# Patient Record
Sex: Male | Born: 1960 | Race: Black or African American | Hispanic: No | Smoking: Current some day smoker
Health system: Southern US, Community
[De-identification: ages and names within clinical notes are randomized; demographics above are authoritative.]

## PROBLEM LIST (undated history)

## (undated) DIAGNOSIS — J90 Pleural effusion, not elsewhere classified: Secondary | ICD-10-CM

## (undated) DIAGNOSIS — M1990 Unspecified osteoarthritis, unspecified site: Secondary | ICD-10-CM

## (undated) DIAGNOSIS — N189 Chronic kidney disease, unspecified: Secondary | ICD-10-CM

## (undated) DIAGNOSIS — C3430 Malignant neoplasm of lower lobe, unspecified bronchus or lung: Secondary | ICD-10-CM

## (undated) DIAGNOSIS — C3490 Malignant neoplasm of unspecified part of unspecified bronchus or lung: Secondary | ICD-10-CM

## (undated) DIAGNOSIS — B1920 Unspecified viral hepatitis C without hepatic coma: Secondary | ICD-10-CM

## (undated) DIAGNOSIS — F1020 Alcohol dependence, uncomplicated: Secondary | ICD-10-CM

## (undated) DIAGNOSIS — K219 Gastro-esophageal reflux disease without esophagitis: Secondary | ICD-10-CM

## (undated) DIAGNOSIS — N179 Acute kidney failure, unspecified: Secondary | ICD-10-CM

## (undated) DIAGNOSIS — I10 Essential (primary) hypertension: Secondary | ICD-10-CM

## (undated) HISTORY — DX: Alcohol dependence, uncomplicated: F10.20

## (undated) HISTORY — DX: Acute kidney failure, unspecified: N17.9

## (undated) HISTORY — PX: CARPAL TUNNEL RELEASE: SHX101

## (undated) HISTORY — DX: Malignant neoplasm of lower lobe, unspecified bronchus or lung: C34.30

## (undated) HISTORY — DX: Pleural effusion, not elsewhere classified: J90

## (undated) HISTORY — DX: Unspecified viral hepatitis C without hepatic coma: B19.20

---

## 1998-12-30 ENCOUNTER — Ambulatory Visit (HOSPITAL_COMMUNITY): Admission: RE | Admit: 1998-12-30 | Discharge: 1998-12-30 | Payer: Self-pay | Admitting: Family Medicine

## 1998-12-30 ENCOUNTER — Encounter: Payer: Self-pay | Admitting: Family Medicine

## 2002-12-31 ENCOUNTER — Encounter: Payer: Self-pay | Admitting: Orthopedic Surgery

## 2002-12-31 ENCOUNTER — Ambulatory Visit (HOSPITAL_COMMUNITY): Admission: RE | Admit: 2002-12-31 | Discharge: 2002-12-31 | Payer: Self-pay | Admitting: Orthopedic Surgery

## 2003-01-02 ENCOUNTER — Ambulatory Visit (HOSPITAL_COMMUNITY): Admission: RE | Admit: 2003-01-02 | Discharge: 2003-01-02 | Payer: Self-pay | Admitting: Orthopedic Surgery

## 2003-01-02 ENCOUNTER — Encounter: Payer: Self-pay | Admitting: Orthopedic Surgery

## 2003-09-13 ENCOUNTER — Emergency Department (HOSPITAL_COMMUNITY): Admission: AD | Admit: 2003-09-13 | Discharge: 2003-09-13 | Payer: Self-pay | Admitting: Family Medicine

## 2003-10-05 ENCOUNTER — Emergency Department (HOSPITAL_COMMUNITY): Admission: EM | Admit: 2003-10-05 | Discharge: 2003-10-05 | Payer: Self-pay | Admitting: Family Medicine

## 2008-10-18 ENCOUNTER — Emergency Department (HOSPITAL_COMMUNITY): Admission: EM | Admit: 2008-10-18 | Discharge: 2008-10-18 | Payer: Self-pay | Admitting: Family Medicine

## 2009-01-14 ENCOUNTER — Emergency Department (HOSPITAL_COMMUNITY): Admission: EM | Admit: 2009-01-14 | Discharge: 2009-01-14 | Payer: Self-pay | Admitting: Family Medicine

## 2009-02-02 ENCOUNTER — Emergency Department (HOSPITAL_COMMUNITY): Admission: EM | Admit: 2009-02-02 | Discharge: 2009-02-02 | Payer: Self-pay | Admitting: Emergency Medicine

## 2009-03-17 ENCOUNTER — Emergency Department (HOSPITAL_COMMUNITY): Admission: EM | Admit: 2009-03-17 | Discharge: 2009-03-17 | Payer: Self-pay | Admitting: Family Medicine

## 2010-04-06 ENCOUNTER — Emergency Department (HOSPITAL_COMMUNITY): Admission: EM | Admit: 2010-04-06 | Discharge: 2010-04-06 | Payer: Self-pay | Admitting: Emergency Medicine

## 2010-05-25 ENCOUNTER — Emergency Department (HOSPITAL_COMMUNITY)
Admission: EM | Admit: 2010-05-25 | Discharge: 2010-05-25 | Payer: Self-pay | Source: Home / Self Care | Admitting: Family Medicine

## 2010-05-25 ENCOUNTER — Emergency Department (HOSPITAL_COMMUNITY)
Admission: EM | Admit: 2010-05-25 | Discharge: 2010-05-26 | Payer: Self-pay | Source: Home / Self Care | Admitting: Emergency Medicine

## 2010-09-06 LAB — DIFFERENTIAL
Lymphocytes Relative: 52 % — ABNORMAL HIGH (ref 12–46)
Lymphs Abs: 3.2 10*3/uL (ref 0.7–4.0)
Monocytes Relative: 10 % (ref 3–12)
Neutro Abs: 2.2 10*3/uL (ref 1.7–7.7)
Neutrophils Relative %: 36 % — ABNORMAL LOW (ref 43–77)

## 2010-09-06 LAB — URINALYSIS, ROUTINE W REFLEX MICROSCOPIC
Glucose, UA: NEGATIVE mg/dL
Protein, ur: NEGATIVE mg/dL
pH: 6 (ref 5.0–8.0)

## 2010-09-06 LAB — LIPASE, BLOOD: Lipase: 45 U/L (ref 11–59)

## 2010-09-06 LAB — CBC
HCT: 47.2 % (ref 39.0–52.0)
MCH: 33.7 pg (ref 26.0–34.0)
MCHC: 36 g/dL (ref 30.0–36.0)
RDW: 14 % (ref 11.5–15.5)

## 2010-09-06 LAB — COMPREHENSIVE METABOLIC PANEL
ALT: 113 U/L — ABNORMAL HIGH (ref 0–53)
Calcium: 9.1 mg/dL (ref 8.4–10.5)
Creatinine, Ser: 1.05 mg/dL (ref 0.4–1.5)
Glucose, Bld: 115 mg/dL — ABNORMAL HIGH (ref 70–99)
Sodium: 135 mEq/L (ref 135–145)
Total Protein: 6.6 g/dL (ref 6.0–8.3)

## 2010-09-06 LAB — URINE MICROSCOPIC-ADD ON

## 2010-10-01 LAB — POCT RAPID STREP A (OFFICE): Streptococcus, Group A Screen (Direct): NEGATIVE

## 2011-04-26 ENCOUNTER — Inpatient Hospital Stay (INDEPENDENT_AMBULATORY_CARE_PROVIDER_SITE_OTHER)
Admission: RE | Admit: 2011-04-26 | Discharge: 2011-04-26 | Disposition: A | Discharge status: Home / Self Care | Payer: Medicare Other | Source: Ambulatory Visit | Attending: Family Medicine | Admitting: Family Medicine

## 2011-04-26 DIAGNOSIS — M79609 Pain in unspecified limb: Secondary | ICD-10-CM

## 2011-05-10 ENCOUNTER — Other Ambulatory Visit: Payer: Self-pay | Admitting: Orthopaedic Surgery

## 2011-05-10 DIAGNOSIS — M5126 Other intervertebral disc displacement, lumbar region: Secondary | ICD-10-CM

## 2011-05-24 ENCOUNTER — Ambulatory Visit
Admission: RE | Admit: 2011-05-24 | Discharge: 2011-05-24 | Disposition: A | Discharge status: Home / Self Care | Payer: Medicare Other | Source: Ambulatory Visit | Attending: Orthopaedic Surgery | Admitting: Orthopaedic Surgery

## 2011-05-24 DIAGNOSIS — M5126 Other intervertebral disc displacement, lumbar region: Secondary | ICD-10-CM

## 2011-05-24 MED ORDER — IOHEXOL 180 MG/ML  SOLN
1.0000 mL | Freq: Once | INTRAMUSCULAR | Status: AC | PRN
  Administered 2011-05-24: 1 mL via EPIDURAL

## 2011-05-24 MED ORDER — METHYLPREDNISOLONE ACETATE 40 MG/ML INJ SUSP (RADIOLOG
120.0000 mg | Freq: Once | INTRAMUSCULAR | Status: AC
  Administered 2011-05-24: 120 mg via EPIDURAL

## 2011-06-14 ENCOUNTER — Other Ambulatory Visit (HOSPITAL_COMMUNITY): Payer: Self-pay | Admitting: Orthopaedic Surgery

## 2011-06-15 ENCOUNTER — Encounter (HOSPITAL_COMMUNITY): Payer: Self-pay | Admitting: Pharmacy Technician

## 2011-06-22 NOTE — Pre-Procedure Instructions (Signed)
[address]le  06/22/2011   Your procedure is scheduled on:  06/30/11  Report to [facility] at 800 AM.  Call this number if you have problems the morning of surgery: [phone]   Remember:   Do not eat food:After Midnight.  May have clear liquids: up to 4 Hours before arrival.  Clear liquids include soda, tea, black coffee, apple or grape juice, broth.  Take these medicines the morning of surgery with A SIP OF WATER: pain med            STOP any asa herbal meds blood thinners now  Do not wear jewelry, make-up or nail polish.  Do not wear lotions, powders, or perfumes. You may wear deodorant.  Do not shave 48 hours prior to surgery.  Do not bring valuables to the hospital.  Contacts, dentures or bridgework may not be worn into surgery.  Leave suitcase in the car. After surgery it may be brought to your room.  For patients admitted to the hospital, checkout time is 11:00 AM the day of discharge.   Patients discharged the day of surgery will not be allowed to drive home.  Name and phone number of your driver:   Special Instructions: CHG Shower Use Special Wash: 1/2 bottle night before surgery and 1/2 bottle morning of surgery.   Please read over the following fact sheets that you were given: Pain Booklet, Coughing and Deep Breathing, MRSA Information and Surgical Site Infection Prevention

## 2011-06-23 ENCOUNTER — Other Ambulatory Visit: Payer: Self-pay

## 2011-06-23 ENCOUNTER — Encounter (HOSPITAL_COMMUNITY)
Admission: RE | Admit: 2011-06-23 | Discharge: 2011-06-23 | Disposition: A | Discharge status: Home / Self Care | Payer: Medicare Other | Source: Ambulatory Visit | Attending: Orthopaedic Surgery | Admitting: Orthopaedic Surgery

## 2011-06-23 ENCOUNTER — Encounter (HOSPITAL_COMMUNITY): Payer: Self-pay

## 2011-06-23 HISTORY — DX: Chronic kidney disease, unspecified: N18.9

## 2011-06-23 HISTORY — DX: Essential (primary) hypertension: I10

## 2011-06-23 HISTORY — DX: Unspecified osteoarthritis, unspecified site: M19.90

## 2011-06-23 HISTORY — DX: Gastro-esophageal reflux disease without esophagitis: K21.9

## 2011-06-23 LAB — COMPREHENSIVE METABOLIC PANEL
ALT: 73 U/L — ABNORMAL HIGH (ref 0–53)
Alkaline Phosphatase: 60 U/L (ref 39–117)
BUN: 11 mg/dL (ref 6–23)
Chloride: 100 mEq/L (ref 96–112)
GFR calc Af Amer: >90 mL/min (ref >90)
Glucose, Bld: 126 mg/dL — ABNORMAL HIGH (ref 70–99)
Potassium: 4.3 mEq/L (ref 3.5–5.1)
Sodium: 141 mEq/L (ref 135–145)
Total Bilirubin: 0.8 mg/dL (ref 0.3–1.2)

## 2011-06-23 LAB — SURGICAL PCR SCREEN
MRSA, PCR: NEGATIVE
Staphylococcus aureus: NEGATIVE

## 2011-06-23 LAB — URINALYSIS, ROUTINE W REFLEX MICROSCOPIC
Glucose, UA: NEGATIVE mg/dL
Protein, ur: 100 mg/dL — AB
Specific Gravity, Urine: 1.031 — ABNORMAL HIGH (ref 1.005–1.030)
Urobilinogen, UA: 4 mg/dL — ABNORMAL HIGH (ref 0.0–1.0)

## 2011-06-23 LAB — CBC
HCT: 51.4 % (ref 39.0–52.0)
Hemoglobin: 18.2 g/dL — ABNORMAL HIGH (ref 13.0–17.0)
RBC: 5.41 MIL/uL (ref 4.22–5.81)
WBC: 5.6 10*3/uL (ref 4.0–10.5)

## 2011-06-23 LAB — URINE MICROSCOPIC-ADD ON

## 2011-06-23 LAB — PROTIME-INR: Prothrombin Time: 12.4 seconds (ref 11.6–15.2)

## 2011-06-26 ENCOUNTER — Other Ambulatory Visit: Payer: Self-pay | Admitting: Orthopaedic Surgery

## 2011-06-26 ENCOUNTER — Ambulatory Visit
Admission: RE | Admit: 2011-06-26 | Discharge: 2011-06-26 | Disposition: A | Discharge status: Home / Self Care | Payer: Medicare Other | Source: Ambulatory Visit | Attending: Orthopaedic Surgery | Admitting: Orthopaedic Surgery

## 2011-06-26 DIAGNOSIS — IMO0001 Reserved for inherently not codable concepts without codable children: Secondary | ICD-10-CM

## 2011-06-26 MED ORDER — IOHEXOL 300 MG/ML  SOLN
75.0000 mL | Freq: Once | INTRAMUSCULAR | Status: AC | PRN
  Administered 2011-06-26: 75 mL via INTRAVENOUS

## 2011-06-28 NOTE — Consult Note (Signed)
Anesthesia:  Patient is a 51 year old male scheduled for a left L5-S1 microdiskectomy on 06/30/11.  His history includes HTN, smoking, kidney stones, GERD, mild hepatic steatosis by prior CT.  Preoperative labs show mildly elevated AST and ALT of 48 and 73, respectively.  These numbers were actually down from 05/25/10. His PLT count is also mildly decreased at 138.  PT/INR are WNL.  His EKG shows NSR with sinus arrhythmia and short PR.    I was asked to review his preoperative CXR from 06/23/11 showing a 1.7 cm cystic/cavitary focus in the left lower lobe. The Radiologist felt this favored sequelae of prior infection or inflammation, and a CT scan was recommended.  This was done on 06/26/11.  Findings reported as:  At least two cystic lesions are noted within the left upper lobe and left lower lobe, respectively. There are also findings of small airways infection as evidenced by tree in bud nodular opacities and mild bronchial wall thickening. The above described cysts could represent bullae with potential superinfection or post inflammatory change. Any further management of the based on assessment for current evidence for infection. These findings otherwise likely do not require specific imaging follow-up, or most could be assessed by repeat PA and lateral chest radiographs after  therapy as indicated.   His pre-op WBC was normal at 5.6K.  Sats were 96% at PAT.  Currently, I am unable to reach him on his home phone to inquire about any persistent or worsening respiratory symptoms.  Clinical correlation day of surgery.  If no worrisome respiratory findings, then plan to proceed.  Reviewed with Dr. Noreene Larsson, who agreed with this plan.

## 2011-06-29 MED ORDER — CEFAZOLIN SODIUM 1-5 GM-% IV SOLN
1.0000 g | INTRAVENOUS | Status: AC
  Administered 2011-06-30: 2 g via INTRAVENOUS
  Filled 2011-06-29: qty 50

## 2011-06-30 ENCOUNTER — Ambulatory Visit (HOSPITAL_COMMUNITY)
Admission: RE | Admit: 2011-06-30 | Discharge: 2011-07-01 | Disposition: A | Discharge status: Home / Self Care | Payer: Medicare Other | Source: Ambulatory Visit | Attending: Orthopaedic Surgery | Admitting: Orthopaedic Surgery

## 2011-06-30 ENCOUNTER — Ambulatory Visit (HOSPITAL_COMMUNITY): Payer: Medicare Other | Admitting: Vascular Surgery

## 2011-06-30 ENCOUNTER — Ambulatory Visit (HOSPITAL_COMMUNITY): Payer: Medicare Other

## 2011-06-30 ENCOUNTER — Encounter (HOSPITAL_COMMUNITY): Payer: Self-pay | Admitting: Vascular Surgery

## 2011-06-30 ENCOUNTER — Encounter (HOSPITAL_COMMUNITY): Payer: Self-pay | Admitting: *Deleted

## 2011-06-30 ENCOUNTER — Encounter (HOSPITAL_COMMUNITY)
Admission: RE | Disposition: A | Discharge status: Home / Self Care | Payer: Self-pay | Source: Ambulatory Visit | Attending: Orthopaedic Surgery

## 2011-06-30 DIAGNOSIS — K219 Gastro-esophageal reflux disease without esophagitis: Secondary | ICD-10-CM | POA: Insufficient documentation

## 2011-06-30 DIAGNOSIS — Z01818 Encounter for other preprocedural examination: Secondary | ICD-10-CM | POA: Insufficient documentation

## 2011-06-30 DIAGNOSIS — R0602 Shortness of breath: Secondary | ICD-10-CM | POA: Insufficient documentation

## 2011-06-30 DIAGNOSIS — M5126 Other intervertebral disc displacement, lumbar region: Secondary | ICD-10-CM | POA: Diagnosis present

## 2011-06-30 DIAGNOSIS — F17200 Nicotine dependence, unspecified, uncomplicated: Secondary | ICD-10-CM | POA: Insufficient documentation

## 2011-06-30 DIAGNOSIS — I10 Essential (primary) hypertension: Secondary | ICD-10-CM | POA: Insufficient documentation

## 2011-06-30 DIAGNOSIS — Z01812 Encounter for preprocedural laboratory examination: Secondary | ICD-10-CM | POA: Insufficient documentation

## 2011-06-30 DIAGNOSIS — Z01810 Encounter for preprocedural cardiovascular examination: Secondary | ICD-10-CM | POA: Insufficient documentation

## 2011-06-30 HISTORY — PX: LUMBAR LAMINECTOMY: SHX95

## 2011-06-30 SURGERY — MICRODISCECTOMY LUMBAR LAMINECTOMY
Anesthesia: General | Site: Back | Laterality: Left | Wound class: Clean

## 2011-06-30 MED ORDER — NEOSTIGMINE METHYLSULFATE 1 MG/ML IJ SOLN
INTRAMUSCULAR | Status: DC | PRN
  Administered 2011-06-30: 5 mg via INTRAVENOUS

## 2011-06-30 MED ORDER — ONDANSETRON HCL 4 MG/2ML IJ SOLN: 4.0000 mg | INTRAMUSCULAR | Status: DC | PRN

## 2011-06-30 MED ORDER — GLYCOPYRROLATE 0.2 MG/ML IJ SOLN
INTRAMUSCULAR | Status: DC | PRN
  Administered 2011-06-30: .8 mg via INTRAVENOUS

## 2011-06-30 MED ORDER — GUAIFENESIN 100 MG/5ML PO LIQD: 200.0000 mg | Freq: Four times a day (QID) | ORAL | Status: DC | PRN

## 2011-06-30 MED ORDER — BISACODYL 10 MG RE SUPP: 10.0000 mg | Freq: Every day | RECTAL | Status: DC | PRN

## 2011-06-30 MED ORDER — HYDROMORPHONE HCL PF 1 MG/ML IJ SOLN
0.2500 mg | INTRAMUSCULAR | Status: DC | PRN
  Administered 2011-06-30 (×2): 0.5 mg via INTRAVENOUS

## 2011-06-30 MED ORDER — MORPHINE SULFATE 4 MG/ML IJ SOLN
1.0000 mg | INTRAMUSCULAR | Status: DC | PRN
  Administered 2011-07-01: 4 mg via INTRAVENOUS
  Filled 2011-06-30: qty 2
  Filled 2011-06-30: qty 1

## 2011-06-30 MED ORDER — SODIUM CHLORIDE 0.9 % IJ SOLN: 3.0000 mL | INTRAMUSCULAR | Status: DC | PRN

## 2011-06-30 MED ORDER — MIDAZOLAM HCL 5 MG/5ML IJ SOLN
INTRAMUSCULAR | Status: DC | PRN
  Administered 2011-06-30: 1 mg via INTRAVENOUS

## 2011-06-30 MED ORDER — OXYCODONE-ACETAMINOPHEN 5-325 MG PO TABS
1.0000 | ORAL_TABLET | ORAL | Status: DC | PRN
  Administered 2011-06-30: 1 via ORAL
  Administered 2011-07-01: 2 via ORAL
  Filled 2011-06-30: qty 1
  Filled 2011-06-30: qty 2

## 2011-06-30 MED ORDER — ACETAMINOPHEN 650 MG RE SUPP: 650.0000 mg | RECTAL | Status: DC | PRN

## 2011-06-30 MED ORDER — MORPHINE SULFATE 2 MG/ML IJ SOLN: 0.0500 mg/kg | INTRAMUSCULAR | Status: DC | PRN

## 2011-06-30 MED ORDER — ALUM & MAG HYDROXIDE-SIMETH 200-200-20 MG/5ML PO SUSP: 30.0000 mL | Freq: Four times a day (QID) | ORAL | Status: DC | PRN

## 2011-06-30 MED ORDER — OXYCODONE-ACETAMINOPHEN 5-325 MG PO TABS: 1.0000 | ORAL_TABLET | ORAL | Status: AC | PRN

## 2011-06-30 MED ORDER — MEPERIDINE HCL 25 MG/ML IJ SOLN: 6.2500 mg | INTRAMUSCULAR | Status: DC | PRN

## 2011-06-30 MED ORDER — MENTHOL 3 MG MT LOZG: 1.0000 | LOZENGE | OROMUCOSAL | Status: DC | PRN

## 2011-06-30 MED ORDER — DEXTROSE 5 % IV SOLN: 500.0000 mg | Freq: Four times a day (QID) | INTRAVENOUS | Status: DC | PRN

## 2011-06-30 MED ORDER — LACTATED RINGERS IV SOLN: INTRAVENOUS | Status: DC

## 2011-06-30 MED ORDER — HYDROCODONE-ACETAMINOPHEN 5-325 MG PO TABS
1.0000 | ORAL_TABLET | ORAL | Status: DC | PRN
  Administered 2011-06-30: 2 via ORAL
  Filled 2011-06-30: qty 2

## 2011-06-30 MED ORDER — CHLORTHALIDONE 25 MG PO TABS
12.5000 mg | ORAL_TABLET | Freq: Every day | ORAL | Status: DC
  Administered 2011-06-30: 12.5 mg via ORAL
  Administered 2011-07-01: 11:00:00 via ORAL
  Filled 2011-06-30 (×2): qty 0.5

## 2011-06-30 MED ORDER — PROMETHAZINE HCL 25 MG/ML IJ SOLN: 6.2500 mg | INTRAMUSCULAR | Status: DC | PRN

## 2011-06-30 MED ORDER — VECURONIUM BROMIDE 10 MG IV SOLR
INTRAVENOUS | Status: DC | PRN
  Administered 2011-06-30: 10 mg via INTRAVENOUS

## 2011-06-30 MED ORDER — SENNOSIDES-DOCUSATE SODIUM 8.6-50 MG PO TABS: 1.0000 | ORAL_TABLET | Freq: Every evening | ORAL | Status: DC | PRN

## 2011-06-30 MED ORDER — DOCUSATE SODIUM 100 MG PO CAPS
100.0000 mg | ORAL_CAPSULE | Freq: Two times a day (BID) | ORAL | Status: DC
  Administered 2011-06-30 – 2011-07-01 (×2): 100 mg via ORAL
  Filled 2011-06-30 (×3): qty 1

## 2011-06-30 MED ORDER — PANTOPRAZOLE SODIUM 40 MG IV SOLR
40.0000 mg | Freq: Every day | INTRAVENOUS | Status: DC
  Administered 2011-06-30: 40 mg via INTRAVENOUS
  Filled 2011-06-30 (×2): qty 40

## 2011-06-30 MED ORDER — FENTANYL CITRATE 0.05 MG/ML IJ SOLN
INTRAMUSCULAR | Status: DC | PRN
  Administered 2011-06-30: 50 ug via INTRAVENOUS
  Administered 2011-06-30: 100 ug via INTRAVENOUS
  Administered 2011-06-30: 50 ug via INTRAVENOUS

## 2011-06-30 MED ORDER — LACTATED RINGERS IV SOLN
INTRAVENOUS | Status: DC | PRN
Start: 2011-06-30 — End: 2011-06-30
  Administered 2011-06-30 (×2): via INTRAVENOUS

## 2011-06-30 MED ORDER — ZOLPIDEM TARTRATE 10 MG PO TABS: 10.0000 mg | ORAL_TABLET | Freq: Every evening | ORAL | Status: DC | PRN

## 2011-06-30 MED ORDER — ACETAMINOPHEN 325 MG PO TABS: 650.0000 mg | ORAL_TABLET | ORAL | Status: DC | PRN

## 2011-06-30 MED ORDER — GUAIFENESIN 100 MG/5ML PO SOLN
10.0000 mL | Freq: Four times a day (QID) | ORAL | Status: DC | PRN
  Filled 2011-06-30: qty 10

## 2011-06-30 MED ORDER — KCL IN DEXTROSE-NACL 20-5-0.45 MEQ/L-%-% IV SOLN
INTRAVENOUS | Status: DC
  Administered 2011-06-30 – 2011-07-01 (×2): via INTRAVENOUS
  Filled 2011-06-30 (×3): qty 1000

## 2011-06-30 MED ORDER — PROPOFOL 10 MG/ML IV EMUL
INTRAVENOUS | Status: DC | PRN
  Administered 2011-06-30: 200 mg via INTRAVENOUS

## 2011-06-30 MED ORDER — PHENOL 1.4 % MT LIQD: 1.0000 | OROMUCOSAL | Status: DC | PRN

## 2011-06-30 MED ORDER — LACTATED RINGERS IV SOLN
INTRAVENOUS | Status: DC
  Administered 2011-06-30: 10:00:00 via INTRAVENOUS

## 2011-06-30 MED ORDER — KETOROLAC TROMETHAMINE 30 MG/ML IJ SOLN
30.0000 mg | Freq: Four times a day (QID) | INTRAMUSCULAR | Status: DC
  Administered 2011-06-30 – 2011-07-01 (×3): 30 mg via INTRAVENOUS
  Filled 2011-06-30 (×7): qty 1

## 2011-06-30 MED ORDER — METHOCARBAMOL 500 MG PO TABS
500.0000 mg | ORAL_TABLET | Freq: Four times a day (QID) | ORAL | Status: DC | PRN
  Administered 2011-06-30 (×2): 500 mg via ORAL
  Filled 2011-06-30 (×2): qty 1

## 2011-06-30 MED ORDER — MIDAZOLAM HCL 2 MG/2ML IJ SOLN: 0.5000 mg | Freq: Once | INTRAMUSCULAR | Status: DC | PRN

## 2011-06-30 MED ORDER — BUPROPION HCL ER (XL) 300 MG PO TB24
300.0000 mg | ORAL_TABLET | Freq: Every day | ORAL | Status: DC
  Administered 2011-06-30 – 2011-07-01 (×2): 300 mg via ORAL
  Filled 2011-06-30 (×2): qty 1

## 2011-06-30 MED ORDER — CEFAZOLIN SODIUM 1-5 GM-% IV SOLN
1.0000 g | Freq: Three times a day (TID) | INTRAVENOUS | Status: AC
  Administered 2011-06-30 (×2): 1 g via INTRAVENOUS
  Filled 2011-06-30 (×2): qty 50

## 2011-06-30 SURGICAL SUPPLY — 48 items
BENZOIN TINCTURE PRP APPL 2/3 (GAUZE/BANDAGES/DRESSINGS) ×2 IMPLANT
BUR ROUND FLUTED 4 SOFT TCH (BURR) IMPLANT
CLOTH BEACON ORANGE TIMEOUT ST (SAFETY) ×2 IMPLANT
CORDS BIPOLAR (ELECTRODE) ×2 IMPLANT
COVER SURGICAL LIGHT HANDLE (MISCELLANEOUS) ×2 IMPLANT
DRAPE MICROSCOPE LEICA (MISCELLANEOUS) ×2 IMPLANT
DRAPE PROXIMA HALF (DRAPES) ×4 IMPLANT
DRSG EMULSION OIL 3X3 NADH (GAUZE/BANDAGES/DRESSINGS) ×2 IMPLANT
DURAPREP 26ML APPLICATOR (WOUND CARE) ×2 IMPLANT
ELECT REM PT RETURN 9FT ADLT (ELECTROSURGICAL) ×2
ELECTRODE REM PT RTRN 9FT ADLT (ELECTROSURGICAL) ×1 IMPLANT
GLOVE BIOGEL PI IND STRL 7.5 (GLOVE) ×1 IMPLANT
GLOVE BIOGEL PI IND STRL 8 (GLOVE) ×1 IMPLANT
GLOVE BIOGEL PI INDICATOR 7.5 (GLOVE) ×1
GLOVE BIOGEL PI INDICATOR 8 (GLOVE) ×1
GLOVE ECLIPSE 7.0 STRL STRAW (GLOVE) ×2 IMPLANT
GLOVE ORTHO TXT STRL SZ7.5 (GLOVE) ×2 IMPLANT
GOWN PREVENTION PLUS LG XLONG (DISPOSABLE) ×2 IMPLANT
GOWN STRL NON-REIN LRG LVL3 (GOWN DISPOSABLE) ×6 IMPLANT
KIT BASIN OR (CUSTOM PROCEDURE TRAY) ×2 IMPLANT
KIT ROOM TURNOVER OR (KITS) ×2 IMPLANT
MANIFOLD NEPTUNE II (INSTRUMENTS) ×2 IMPLANT
NDL SUT .5 MAYO 1.404X.05X (NEEDLE) ×1 IMPLANT
NEEDLE HYPO 25GX1X1/2 BEV (NEEDLE) ×2 IMPLANT
NEEDLE MAYO TAPER (NEEDLE) ×1
NEEDLE SPNL 18GX3.5 QUINCKE PK (NEEDLE) ×2 IMPLANT
NS IRRIG 1000ML POUR BTL (IV SOLUTION) ×2 IMPLANT
PACK LAMINECTOMY ORTHO (CUSTOM PROCEDURE TRAY) ×2 IMPLANT
PAD ARMBOARD 7.5X6 YLW CONV (MISCELLANEOUS) ×4 IMPLANT
PATTIES SURGICAL .5 X.5 (GAUZE/BANDAGES/DRESSINGS) IMPLANT
PATTIES SURGICAL .75X.75 (GAUZE/BANDAGES/DRESSINGS) IMPLANT
SPONGE GAUZE 4X4 12PLY (GAUZE/BANDAGES/DRESSINGS) ×2 IMPLANT
SPONGE GAUZE 4X4 STERILE 39 (GAUZE/BANDAGES/DRESSINGS) ×2 IMPLANT
STAPLER VISISTAT 35W (STAPLE) IMPLANT
STRIP CLOSURE SKIN 1/2X4 (GAUZE/BANDAGES/DRESSINGS) IMPLANT
SUT VIC AB 0 CT1 27 (SUTURE) ×1
SUT VIC AB 0 CT1 27XBRD ANBCTR (SUTURE) ×1 IMPLANT
SUT VIC AB 2-0 CT1 27 (SUTURE) ×1
SUT VIC AB 2-0 CT1 TAPERPNT 27 (SUTURE) ×1 IMPLANT
SUT VICRYL 0 TIES 12 18 (SUTURE) IMPLANT
SUT VICRYL 4-0 PS2 18IN ABS (SUTURE) ×2 IMPLANT
SUT VICRYL AB 2 0 TIES (SUTURE) ×2 IMPLANT
SYR 20ML ECCENTRIC (SYRINGE) IMPLANT
SYR CONTROL 10ML LL (SYRINGE) ×2 IMPLANT
TAPE CLOTH SURG 6X10 WHT LF (GAUZE/BANDAGES/DRESSINGS) ×2 IMPLANT
TOWEL OR 17X24 6PK STRL BLUE (TOWEL DISPOSABLE) ×2 IMPLANT
TOWEL OR 17X26 10 PK STRL BLUE (TOWEL DISPOSABLE) ×2 IMPLANT
WATER STERILE IRR 1000ML POUR (IV SOLUTION) IMPLANT

## 2011-06-30 NOTE — Preoperative (Addendum)
Beta Blockers   Reason not to administer Beta Blockers:Not Applicable 

## 2011-06-30 NOTE — Transfer of Care (Signed)
Immediate Anesthesia Transfer of Care Note  Patient: Johnny Contreras  Procedure(s) Performed:  MICRODISCECTOMY LUMBAR LAMINECTOMY - Left L5-S1 Microdiscectomy  Patient Location: PACU  Anesthesia Type: General  Level of Consciousness: awake and alert   Airway & Oxygen Therapy: Patient Spontanous Breathing and Patient connected to face mask oxygen  Post-op Assessment: Report given to PACU RN, Post -op Vital signs reviewed and stable and Patient moving all extremities X 4  Post vital signs: Reviewed and stable  Complications: No apparent anesthesia complications

## 2011-06-30 NOTE — Brief Op Note (Signed)
06/30/2011  11:34 AM  PATIENT:  Johnny Contreras  51 y.o. male  PRE-OPERATIVE DIAGNOSIS:  Left L5-S1 HNP  POST-OPERATIVE DIAGNOSIS:  Left L5-S1 HNP  PROCEDURE:  Procedure(s): MICRODISCECTOMY LUMBAR LAMINECTOMY left L5-S1  SURGEON:  Surgeon(s): Eldred Manges  PHYSICIAN ASSISTANT: Maurie Boettcher  ASSISTANTS: none   ANESTHESIA:   general  EBL:     BLOOD ADMINISTERED:none  DRAINS: none   LOCAL MEDICATIONS USED:  NONE  SPECIMEN:  No Specimen  DISPOSITION OF SPECIMEN:  N/A  COUNTS:  YES  TOURNIQUET:  * No tourniquets in log *  DICTATION: .Note written in EPIC and Other Dictation: Dictation Number [id number]  PLAN OF CARE: Admit for overnight observation  PATIENT DISPOSITION:  PACU - hemodynamically stable.   Delay start of Pharmacological VTE agent (>24hrs) due to surgical blood loss or risk of bleeding:  {YES/NO/NOT APPLICABLE:20182

## 2011-06-30 NOTE — Anesthesia Postprocedure Evaluation (Signed)
  Anesthesia Post-op Note  Patient: Johnny Contreras  Procedure(s) Performed:  MICRODISCECTOMY LUMBAR LAMINECTOMY - Left L5-S1 Microdiscectomy  Patient Location: PACU  Anesthesia Type: General  Level of Consciousness: awake  Airway and Oxygen Therapy: Patient Spontanous Breathing  Post-op Pain: mild  Post-op Assessment: Post-op Vital signs reviewed  Post-op Vital Signs: stable  Complications: No apparent anesthesia complications
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
## 2011-06-30 NOTE — Anesthesia Procedure Notes (Signed)
Procedure Name: Intubation Date/Time: 06/30/2011 10:13 AM Performed by: Carmela Rima Pre-anesthesia Checklist: Patient identified, Timeout performed, Emergency Drugs available, Suction available and Patient being monitored Patient Re-evaluated:Patient Re-evaluated prior to inductionOxygen Delivery Method: Circle System Utilized Preoxygenation: Pre-oxygenation with 100% oxygen Intubation Type: IV induction Ventilation: Mask ventilation without difficulty Laryngoscope Size: Mac and 4 Grade View: Grade I Tube type: Oral Tube size: 7.5 mm Number of attempts: 1 Placement Confirmation: ETT inserted through vocal cords under direct vision,  breath sounds checked- equal and bilateral,  positive ETCO2 and CO2 detector Secured at: 23 cm Tube secured with: Tape Dental Injury: Teeth and Oropharynx as per pre-operative assessment

## 2011-06-30 NOTE — Anesthesia Preprocedure Evaluation (Addendum)
Anesthesia Evaluation  Patient identified by MRN, date of birth, ID band Patient awake    Reviewed: Allergy & Precautions, H&P , NPO status , Patient's Chart, lab work & pertinent test results  Airway Mallampati: II TM Distance: >3 FB Neck ROM: full    Dental  (+) Dental Advidsory Given   Pulmonary shortness of breath and with exertion, Current Smoker,  clear to auscultation        Cardiovascular hypertension, Regular Normal    Neuro/Psych Negative Neurological ROS     GI/Hepatic Neg liver ROS, GERD-  ,  Endo/Other  Negative Endocrine ROS  Renal/GU negative Renal ROS     Musculoskeletal negative musculoskeletal ROS (+)   Abdominal   Peds  Hematology negative hematology ROS (+)   Anesthesia Other Findings   Reproductive/Obstetrics                          Anesthesia Physical Anesthesia Plan  ASA: III  Anesthesia Plan: General   Post-op Pain Management:    Induction: Intravenous  Airway Management Planned: Oral ETT  Additional Equipment:   Intra-op Plan:   Post-operative Plan: Extubation in OR  Informed Consent:   Plan Discussed with: CRNA  Anesthesia Plan Comments:         Anesthesia Quick Evaluation

## 2011-06-30 NOTE — Plan of Care (Signed)
Problem: Consults Goal: Diagnosis - Spinal Surgery Lumbar laminectomy

## 2011-07-01 MED ORDER — INFLUENZA VIRUS VACC SPLIT PF IM SUSP
0.5000 mL | INTRAMUSCULAR | Status: AC | PRN
  Administered 2011-07-01: 0.5 mL via INTRAMUSCULAR

## 2011-07-01 MED ORDER — PNEUMOCOCCAL VAC POLYVALENT 25 MCG/0.5ML IJ INJ: 0.5000 mL | INJECTION | INTRAMUSCULAR | Status: DC

## 2011-07-01 NOTE — Progress Notes (Signed)
Patient ID: Johnny Contreras, male   DOB: April 26, 1961, 51 y.o.   MRN: [id number] Pt. Discharged 07/01/2011  11:57 AM Discharge instructions reviewed with patient/family. Patient/family verbalized understanding. All Rx's given. Questions answered as needed. Pt. Discharged to home with family/self. Taken off unit via W/C. Lurline Idol [facility]

## 2011-07-01 NOTE — Op Note (Signed)
[name], Contreras NO.:  [id number]  MEDICAL RECORD NO.:  [id number]  LOCATION:  5034                         FACILITY:  [facility]  PHYSICIAN:  Logen Fowle C. Ophelia Charter, M.D.    DATE OF BIRTH:  1960/10/22  DATE OF PROCEDURE:  06/30/2011 DATE OF DISCHARGE:                              OPERATIVE REPORT   PREOPERATIVE DIAGNOSIS:  Left L5-S1 herniated nucleus pulposus with radiculopathy.  POSTOPERATIVE DIAGNOSIS:  Left L5-S1 herniated nucleus pulposus with radiculopathy.  PROCEDURE:  Left L5-S1 microdiskectomy.  SURGEON:  Treylen Gibbs C. Ophelia Charter, MD  ASSISTANT:  Wende Neighbors, PA-C, medically necessary and present for the entire procedure.  ESTIMATED BLOOD LOSS:  Minimal.  FLUIDS:  None.  DRAINS:  None.  PROCEDURE IN DETAIL:  After induction of general anesthesia, standard prepping and draping with DuraPrep, needle localization with a spinal needle.  Cross-table lateral x-ray confirmed it was just below the 5-1 level.  Incision was made 2 mm to the left at the L5-S1 interspace, subperiosteal dissection down to the L5 lamina.  Taylor retractor placed laterally.  Soft tissue cleaned off at the top of the lamina and lateral gutter with the cup curette.  A 2 mm Kerrison was used to start a small laminotomy at L5 removing some overhanging facets and the top portion of the lamina S1, this was a large with a 3-mm Kerrison.  Nerve root was noted and large disk was noted with combination of up sweep spur off L5 which gave pseudoretrolisthesis at L5-S1.  Anteriorly L5-S1 vertebral body lined up well, posteriorly there was a large extruded disk, large portion of it sitting behind S1 which was paracentral, some portion of extended over to the right-side broad-based.  Annulus was incised and then partial diskectomy was performed removing a portion of disk that was protruding.  Some of this was fibrotic, had been pushed down into the disk and then removed with pituitaries using the  Epstein curettes repetitively, pushed disk material down, were to be grasped and reached and removed without damaging the dura.  The Scarlette Slice was used for careful protection of the nerve root.  Foraminotomy was performed.  There was no extruded fragments out the foramina.  Epstein was then flipped around and used laterally on the left side, continued removal of disk until there was good decompression of the dura and the hockey stick could be swept anterior to the dura.  There was still spur present on L5, but in fact it would not reach this area and the largest Epstein was used repetitively pushing on the spur and then using pan pressure pounding the tip to push the bone piece down away from the dura.  This was done using the operative microscope that had been draped and brought in as soon as the laminotomy was performed.  Continued decompression until remaining portions that of the extruded disk was removed.  Hockey stick was used to reach across the midline anteriorly, pushed additional fragments out.  Irrigation with saline solution, closure of fascia deep with 0 Vicryl, 2-0 Vicryl, subcutaneous tissue, 4-0 Vicryl subcuticular skin closure.  Tincture of benzoin, Steri-Strips, Marcaine infiltration, postop dressing, and turned supine, extubated, and transferred to recovery room  in stable condition.  Instrument count and needle count was correct.  The patient tolerated procedure well, and was stable in recovery room.     Johnny Contreras C. Ophelia Charter, M.D.     MCY/MEDQ  D:  06/30/2011  T:  07/01/2011  Job:  [id number]

## 2011-07-01 NOTE — Discharge Summary (Signed)
Physician Discharge Summary  Patient ID: Johnny Contreras MRN: [id number] DOB/AGE: 1961-02-02 51 y.o.  Admit date: 06/30/2011 Discharge date: 07/01/2011  Admission Diagnoses:  HNP (herniated nucleus pulposus), lumbar  Discharge Diagnoses:  Principal Problem:  *HNP (herniated nucleus pulposus), lumbar   Past Medical History  Diagnosis Date  . Shortness of breath     has cold now cough  . Chronic kidney disease     kidney stones ?  Marland Kitchen Arthritis   . GERD (gastroesophageal reflux disease)   . Hypertension     Surgeries: Procedure(s): MICRODISCECTOMY LUMBAR LAMINECTOMY on 06/30/2011   Consultants (if any):    Discharged Condition: Improved  Hospital Course: Johnny Contreras is an 51 y.o. male who was admitted 06/30/2011 with a diagnosis of HNP (herniated nucleus pulposus), lumbar and went to the operating room on 06/30/2011 and underwent the above named procedures.    He was given perioperative antibiotics:  Anti-infectives     Start     Dose/Rate Route Frequency Ordered Stop   06/30/11 1600   ceFAZolin (ANCEF) IVPB 1 g/50 mL premix        1 g 100 mL/hr over 30 Minutes Intravenous 3 times per day 06/30/11 1408 06/30/11 2308   06/29/11 1445   ceFAZolin (ANCEF) IVPB 1 g/50 mL premix        1 g 100 mL/hr over 30 Minutes Intravenous 60 min pre-op 06/29/11 1433 06/30/11 1007        .  He was given sequential compression devices, early ambulation, and chemoprophylaxis for DVT prophylaxis.  He benefited maximally from their hospital stay and there were no complications.    Recent vital signs:  Filed Vitals:   07/01/11 0516  BP: 131/68  Pulse: 67  Temp: 98 F (36.7 C)  Resp: 18    Recent laboratory studies:  Lab Results  Component Value Date   HGB 18.2* 06/23/2011   HGB 17.0 05/25/2010   Lab Results  Component Value Date   WBC 5.6 06/23/2011   PLT 138* 06/23/2011   Lab Results  Component Value Date   INR 0.91 06/23/2011   Lab Results  Component Value Date   NA  141 06/23/2011   K 4.3 06/23/2011   CL 100 06/23/2011   CO2 29 06/23/2011   BUN 11 06/23/2011   CREATININE 0.81 06/23/2011   GLUCOSE 126* 06/23/2011    Discharge Medications:   Current Discharge Medication List    START taking these medications   Details  oxyCODONE-acetaminophen (PERCOCET) 5-325 MG per tablet Take 1-2 tablets by mouth every 4 (four) hours as needed. Qty: 60 tablet, Refills: 0      CONTINUE these medications which have NOT CHANGED   Details  chlorthalidone (HYGROTON) 25 MG tablet Take 12.5 mg by mouth daily.      guaiFENesin (ROBITUSSIN) 100 MG/5ML liquid Take 200 mg by mouth 4 (four) times daily as needed.        STOP taking these medications     HYDROcodone-acetaminophen (NORCO) 7.5-325 MG per tablet         Diagnostic Studies: Dg Chest 2 View  06/23/2011  *RADIOLOGY REPORT*  Clinical Data: Preoperative radiograph, cough.  CHEST - 2 VIEW  Comparison: None.  Findings: 1.7 cm cystic focus within the lingula with relatively thin rim. Lungs are otherwise clear. No pleural effusion or pneumothorax. The cardiomediastinal contours are within normal limits. The visualized bones and soft tissues are without significant appreciable abnormality.  IMPRESSION: 1.7 cm cystic/cavitary focus in the left lower  lobe.  Given this is solitary and with a relatively thin rim, favored to represent sequelae of prior infection or inflammation. Recommend a CT to better characterize.  Original Report Authenticated By: Waneta Martins, M.D.   Dg Lumbar Spine 2-3 Views  06/30/2011  *RADIOLOGY REPORT*  Clinical Data: L5-S1 disc protrusion.  LUMBAR SPINE - 2-3 VIEW  Comparison: MRI dated 05/06/2011  Findings: Radiograph #1 demonstrates an instrument at the S1 level. Radiograph #2 demonstrates instruments at the L5 S1 level.  IMPRESSION: Instruments at the L5-S1 on radiograph #2.  Original Report Authenticated By: Gwynn Burly, M.D.   Ct Chest W Contrast  06/26/2011  *RADIOLOGY  REPORT*  Clinical Data: Left lung cyst  CT CHEST WITH CONTRAST  Technique:  Multidetector CT imaging of the chest was performed following the standard protocol during bolus administration of intravenous contrast.  Contrast: 75mL OMNIPAQUE IOHEXOL 300 MG/ML IV SOLN  Comparison: Chest radiograph 06/23/2011  Findings: 1.5 cm thin rim cavitary lesion is noted at the base of the left upper lobe, measured on image 38, series 3.  There may be an adjacent smaller 0.5 cm subpleural nodule with internal lucency/cavitation on image 36.  Tree in bud nodular type airspace opacities are noted at the base of the right upper lobe and within the bilateral lower lobes.  There is minimal central bronchial wall thickening.  Left lower lobe subpleural 0.8 cm cyst versus bullae noted on image 38.  No lymphadenopathy.  Mild coronary serial calcification noted. Heart size is normal.  No pericardial or pleural effusion.  No acute osseous abnormality.  IMPRESSION: At least two cystic lesions are noted within the left upper lobe and left lower lobe, respectively.  There are also findings of small airways infection as evidenced by tree in bud nodular opacities and mild bronchial wall thickening.  The above described cysts could represent bullae with potential superinfection or post inflammatory change.  Any further management of the based on assessment for current evidence for infection.  These findings otherwise likely do not require specific imaging follow-up, or most could be assessed by repeat PA and lateral chest radiographs after therapy as indicated.  Original Report Authenticated By: Harrel Lemon, M.D.    Disposition: Final discharge disposition not confirmed  Discharge Orders    Future Orders Please Complete By Expires   Diet - low sodium heart healthy      Call MD / Call 911      Comments:   If you experience chest pain or shortness of breath, CALL 911 and be transported to the hospital emergency room.  If you develope  a fever above 101 F, pus (white drainage) or increased drainage or redness at the wound, or calf pain, call your surgeon's office.   Constipation Prevention      Comments:   Drink plenty of fluids.  Prune juice may be helpful.  You may use a stool softener, such as Colace (over the counter) 100 mg twice a day.  Use MiraLax (over the counter) for constipation as needed.   Increase activity slowly as tolerated      Discharge instructions      Comments:   Change dressing as needed. Keep wound dry and clean.  No bending, lifting or twisting.  Ice packs as needed to low back. Walk as tolerated   Driving restrictions      Comments:   No driving   Lifting restrictions      Comments:   No lifting  Follow-up Information    Follow up with YATES,MARK C. Make an appointment in 1 week.   Contact information:   [facility] [address] [place] [address] [phone]           Signed: Kathryne Hitch 07/01/2011, 6:19 AM

## 2011-07-03 ENCOUNTER — Encounter (HOSPITAL_COMMUNITY): Payer: Self-pay | Admitting: Orthopaedic Surgery

## 2012-01-25 DIAGNOSIS — J449 Chronic obstructive pulmonary disease, unspecified: Secondary | ICD-10-CM | POA: Insufficient documentation

## 2012-02-28 DIAGNOSIS — M549 Dorsalgia, unspecified: Secondary | ICD-10-CM | POA: Insufficient documentation

## 2012-03-20 DIAGNOSIS — C3430 Malignant neoplasm of lower lobe, unspecified bronchus or lung: Secondary | ICD-10-CM

## 2012-03-20 HISTORY — DX: Malignant neoplasm of lower lobe, unspecified bronchus or lung: C34.30

## 2012-03-26 DIAGNOSIS — C3490 Malignant neoplasm of unspecified part of unspecified bronchus or lung: Secondary | ICD-10-CM

## 2012-03-26 HISTORY — DX: Malignant neoplasm of unspecified part of unspecified bronchus or lung: C34.90

## 2012-03-26 HISTORY — PX: LUNG CANCER SURGERY: SHX702

## 2012-06-22 ENCOUNTER — Encounter (HOSPITAL_COMMUNITY): Payer: Self-pay | Admitting: Emergency Medicine

## 2012-06-22 ENCOUNTER — Emergency Department (HOSPITAL_COMMUNITY): Payer: Medicare Other

## 2012-06-22 ENCOUNTER — Inpatient Hospital Stay (HOSPITAL_COMMUNITY)
Admission: EM | Admit: 2012-06-22 | Discharge: 2012-06-22 | DRG: 187 | Disposition: A | Discharge status: Other Acute Inpatient Hospital | Payer: Medicare Other | Attending: Internal Medicine | Admitting: Internal Medicine

## 2012-06-22 DIAGNOSIS — F17200 Nicotine dependence, unspecified, uncomplicated: Secondary | ICD-10-CM | POA: Diagnosis present

## 2012-06-22 DIAGNOSIS — N179 Acute kidney failure, unspecified: Secondary | ICD-10-CM | POA: Diagnosis present

## 2012-06-22 DIAGNOSIS — R0781 Pleurodynia: Secondary | ICD-10-CM | POA: Diagnosis present

## 2012-06-22 DIAGNOSIS — M1990 Unspecified osteoarthritis, unspecified site: Secondary | ICD-10-CM | POA: Diagnosis present

## 2012-06-22 DIAGNOSIS — I10 Essential (primary) hypertension: Secondary | ICD-10-CM | POA: Diagnosis present

## 2012-06-22 DIAGNOSIS — J90 Pleural effusion, not elsewhere classified: Secondary | ICD-10-CM

## 2012-06-22 DIAGNOSIS — F1010 Alcohol abuse, uncomplicated: Secondary | ICD-10-CM | POA: Diagnosis present

## 2012-06-22 DIAGNOSIS — K219 Gastro-esophageal reflux disease without esophagitis: Secondary | ICD-10-CM | POA: Diagnosis present

## 2012-06-22 DIAGNOSIS — IMO0001 Reserved for inherently not codable concepts without codable children: Secondary | ICD-10-CM

## 2012-06-22 DIAGNOSIS — Z720 Tobacco use: Secondary | ICD-10-CM | POA: Diagnosis present

## 2012-06-22 DIAGNOSIS — F411 Generalized anxiety disorder: Secondary | ICD-10-CM | POA: Diagnosis present

## 2012-06-22 DIAGNOSIS — C3490 Malignant neoplasm of unspecified part of unspecified bronchus or lung: Secondary | ICD-10-CM | POA: Diagnosis present

## 2012-06-22 DIAGNOSIS — F1020 Alcohol dependence, uncomplicated: Secondary | ICD-10-CM | POA: Diagnosis present

## 2012-06-22 HISTORY — DX: Pleural effusion, not elsewhere classified: J90

## 2012-06-22 HISTORY — DX: Reserved for inherently not codable concepts without codable children: IMO0001

## 2012-06-22 HISTORY — DX: Acute kidney failure, unspecified: N17.9

## 2012-06-22 HISTORY — DX: Malignant neoplasm of unspecified part of unspecified bronchus or lung: C34.90

## 2012-06-22 LAB — COMPREHENSIVE METABOLIC PANEL
ALT: 43 U/L (ref 0–53)
AST: 32 U/L (ref 0–37)
Albumin: 3.4 g/dL — ABNORMAL LOW (ref 3.5–5.2)
Alkaline Phosphatase: 81 U/L (ref 39–117)
Calcium: 9.3 mg/dL (ref 8.4–10.5)
GFR calc Af Amer: 51 mL/min — ABNORMAL LOW (ref >90)
Potassium: 3.4 mEq/L — ABNORMAL LOW (ref 3.5–5.1)
Sodium: 142 mEq/L (ref 135–145)
Total Protein: 6.4 g/dL (ref 6.0–8.3)

## 2012-06-22 LAB — CBC WITH DIFFERENTIAL/PLATELET
Basophils Absolute: 0 10*3/uL (ref 0.0–0.1)
Basophils Relative: 0 % (ref 0–1)
Eosinophils Absolute: 0 10*3/uL (ref 0.0–0.7)
Eosinophils Relative: 0 % (ref 0–5)
MCH: 31.2 pg (ref 26.0–34.0)
MCV: 92 fL (ref 78.0–100.0)
Neutrophils Relative %: 76 % (ref 43–77)
Platelets: 213 10*3/uL (ref 150–400)
RBC: 3.88 MIL/uL — ABNORMAL LOW (ref 4.22–5.81)
RDW: 16.2 % — ABNORMAL HIGH (ref 11.5–15.5)

## 2012-06-22 LAB — CBC
HCT: 28.2 % — ABNORMAL LOW (ref 39.0–52.0)
HCT: 24 % — ABNORMAL LOW (ref 39.0–52.0)
MCH: 31.4 pg (ref 26.0–34.0)
MCHC: 33.8 g/dL (ref 30.0–36.0)
MCV: 92.2 fL (ref 78.0–100.0)
MCV: 92.7 fL (ref 78.0–100.0)
Platelets: 224 10*3/uL (ref 150–400)
RBC: 3.06 MIL/uL — ABNORMAL LOW (ref 4.22–5.81)
RDW: 16.5 % — ABNORMAL HIGH (ref 11.5–15.5)
RDW: 16.7 % — ABNORMAL HIGH (ref 11.5–15.5)
WBC: 38.9 10*3/uL — ABNORMAL HIGH (ref 4.0–10.5)
WBC: 42.4 10*3/uL — ABNORMAL HIGH (ref 4.0–10.5)

## 2012-06-22 MED ORDER — VANCOMYCIN HCL IN DEXTROSE 1-5 GM/200ML-% IV SOLN
1000.0000 mg | INTRAVENOUS | Status: DC
  Filled 2012-06-22: qty 200

## 2012-06-22 MED ORDER — ONDANSETRON HCL 4 MG/2ML IJ SOLN
4.0000 mg | Freq: Once | INTRAMUSCULAR | Status: AC
  Administered 2012-06-22: 4 mg via INTRAVENOUS
  Filled 2012-06-22: qty 2

## 2012-06-22 MED ORDER — FENTANYL CITRATE 0.05 MG/ML IJ SOLN
100.0000 ug | INTRAMUSCULAR | Status: DC | PRN
  Administered 2012-06-22 (×3): 100 ug via INTRAVENOUS
  Filled 2012-06-22 (×2): qty 2

## 2012-06-22 MED ORDER — ONDANSETRON HCL 4 MG/2ML IJ SOLN
INTRAMUSCULAR | Status: AC
  Administered 2012-06-22: 4 mg via INTRAVENOUS
  Filled 2012-06-22: qty 2

## 2012-06-22 MED ORDER — ACETAMINOPHEN 650 MG RE SUPP: 650.0000 mg | Freq: Four times a day (QID) | RECTAL | Status: DC | PRN

## 2012-06-22 MED ORDER — SODIUM CHLORIDE 0.9 % IV SOLN
INTRAVENOUS | Status: DC
  Administered 2012-06-22 (×2): via INTRAVENOUS

## 2012-06-22 MED ORDER — PANTOPRAZOLE SODIUM 40 MG PO TBEC: 40.0000 mg | DELAYED_RELEASE_TABLET | Freq: Every day | ORAL | Status: DC

## 2012-06-22 MED ORDER — FENTANYL CITRATE 0.05 MG/ML IJ SOLN
50.0000 ug | Freq: Once | INTRAMUSCULAR | Status: AC
  Administered 2012-06-22: 50 ug via INTRAVENOUS
  Filled 2012-06-22: qty 2

## 2012-06-22 MED ORDER — CHLORPROMAZINE HCL 10 MG PO TABS
10.0000 mg | ORAL_TABLET | Freq: Three times a day (TID) | ORAL | Status: DC | PRN
  Administered 2012-06-22: 10 mg via ORAL
  Filled 2012-06-22: qty 1

## 2012-06-22 MED ORDER — SODIUM CHLORIDE 0.9 % IV SOLN
Freq: Once | INTRAVENOUS | Status: AC
  Administered 2012-06-22: 175 mL/h via INTRAVENOUS

## 2012-06-22 MED ORDER — PIPERACILLIN-TAZOBACTAM 3.375 G IVPB
3.3750 g | Freq: Three times a day (TID) | INTRAVENOUS | Status: DC
  Administered 2012-06-22: 3.375 g via INTRAVENOUS
  Filled 2012-06-22 (×3): qty 50

## 2012-06-22 MED ORDER — PIPERACILLIN-TAZOBACTAM 3.375 G IVPB 30 MIN
3.3750 g | Freq: Three times a day (TID) | INTRAVENOUS | Status: DC
  Filled 2012-06-22 (×3): qty 50

## 2012-06-22 MED ORDER — ONDANSETRON HCL 4 MG/2ML IJ SOLN
8.0000 mg | Freq: Four times a day (QID) | INTRAMUSCULAR | Status: DC | PRN
  Administered 2012-06-22: 8 mg via INTRAVENOUS
  Filled 2012-06-22 (×2): qty 2

## 2012-06-22 MED ORDER — LISINOPRIL 10 MG PO TABS: 10.0000 mg | ORAL_TABLET | Freq: Every day | ORAL | Status: DC

## 2012-06-22 MED ORDER — SODIUM CHLORIDE 0.9 % IJ SOLN: 3.0000 mL | Freq: Two times a day (BID) | INTRAMUSCULAR | Status: DC

## 2012-06-22 MED ORDER — LORAZEPAM 0.5 MG PO TABS
0.5000 mg | ORAL_TABLET | ORAL | Status: DC | PRN
  Administered 2012-06-22: 0.5 mg via ORAL
  Filled 2012-06-22: qty 1

## 2012-06-22 MED ORDER — ONDANSETRON HCL 4 MG PO TABS: 8.0000 mg | ORAL_TABLET | Freq: Four times a day (QID) | ORAL | Status: DC | PRN

## 2012-06-22 MED ORDER — PANTOPRAZOLE SODIUM 40 MG IV SOLR
40.0000 mg | INTRAVENOUS | Status: DC
  Administered 2012-06-22: 40 mg via INTRAVENOUS
  Filled 2012-06-22 (×2): qty 40

## 2012-06-22 MED ORDER — FENTANYL CITRATE 0.05 MG/ML IJ SOLN
50.0000 ug | Freq: Once | INTRAMUSCULAR | Status: AC
  Administered 2012-06-22: 50 ug via INTRAVENOUS

## 2012-06-22 MED ORDER — VITAMIN B-1 100 MG PO TABS
100.0000 mg | ORAL_TABLET | Freq: Every day | ORAL | Status: DC
  Administered 2012-06-22: 100 mg via ORAL
  Filled 2012-06-22: qty 1

## 2012-06-22 MED ORDER — ADULT MULTIVITAMIN W/MINERALS CH
1.0000 | ORAL_TABLET | Freq: Every day | ORAL | Status: DC
  Administered 2012-06-22: 1 via ORAL
  Filled 2012-06-22: qty 1

## 2012-06-22 MED ORDER — ENOXAPARIN SODIUM 40 MG/0.4ML ~~LOC~~ SOLN
40.0000 mg | SUBCUTANEOUS | Status: DC
  Administered 2012-06-22: 40 mg via SUBCUTANEOUS
  Filled 2012-06-22: qty 0.4

## 2012-06-22 MED ORDER — LORAZEPAM 1 MG PO TABS: 1.0000 mg | ORAL_TABLET | Freq: Four times a day (QID) | ORAL | Status: DC | PRN

## 2012-06-22 MED ORDER — FENTANYL CITRATE 0.05 MG/ML IJ SOLN
100.0000 ug | INTRAMUSCULAR | Status: DC | PRN
  Administered 2012-06-22: 100 ug via INTRAVENOUS
  Filled 2012-06-22 (×2): qty 2

## 2012-06-22 MED ORDER — OXYCODONE-ACETAMINOPHEN 5-325 MG PO TABS
1.0000 | ORAL_TABLET | ORAL | Status: DC | PRN
  Administered 2012-06-22 (×2): 1 via ORAL
  Filled 2012-06-22 (×2): qty 1

## 2012-06-22 MED ORDER — FOLIC ACID 1 MG PO TABS
1.0000 mg | ORAL_TABLET | Freq: Every day | ORAL | Status: DC
  Administered 2012-06-22: 1 mg via ORAL
  Filled 2012-06-22: qty 1

## 2012-06-22 MED ORDER — IOHEXOL 350 MG/ML SOLN
60.0000 mL | Freq: Once | INTRAVENOUS | Status: AC | PRN
  Administered 2012-06-22: 60 mL via INTRAVENOUS

## 2012-06-22 MED ORDER — OXYCODONE HCL ER 10 MG PO T12A
20.0000 mg | EXTENDED_RELEASE_TABLET | Freq: Two times a day (BID) | ORAL | Status: DC
  Administered 2012-06-22: 20 mg via ORAL
  Filled 2012-06-22: qty 2

## 2012-06-22 MED ORDER — THIAMINE HCL 100 MG/ML IJ SOLN: Freq: Once | INTRAVENOUS | Status: DC

## 2012-06-22 MED ORDER — ONDANSETRON HCL 4 MG/2ML IJ SOLN
4.0000 mg | Freq: Once | INTRAMUSCULAR | Status: AC
  Administered 2012-06-22: 4 mg via INTRAVENOUS

## 2012-06-22 MED ORDER — LORAZEPAM 2 MG/ML IJ SOLN: 1.0000 mg | Freq: Four times a day (QID) | INTRAMUSCULAR | Status: DC | PRN

## 2012-06-22 MED ORDER — ONDANSETRON HCL 4 MG PO TABS: 8.0000 mg | ORAL_TABLET | Freq: Three times a day (TID) | ORAL | Status: DC | PRN

## 2012-06-22 MED ORDER — ACETAMINOPHEN 325 MG PO TABS: 650.0000 mg | ORAL_TABLET | Freq: Four times a day (QID) | ORAL | Status: DC | PRN

## 2012-06-22 MED ORDER — OXYCODONE HCL 10 MG PO TB12: 20.0000 mg | ORAL_TABLET | Freq: Two times a day (BID) | ORAL | Status: DC | PRN

## 2012-06-22 NOTE — ED Notes (Signed)
Pt given ice chips, report give to [place], Charity fundraiser. Pt transported to floor.

## 2012-06-22 NOTE — ED Notes (Signed)
Dr Read Drivers notified of X-ray results for pleural effusion.  Pt breathing stable - Sats of 99%, RR 30.  Placed on 2L per pt request, stating it makes breathing easier.

## 2012-06-22 NOTE — ED Notes (Addendum)
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  States 3 days ago he exp epigastric pain and emesis x 1.  Later on, he was raising his L arm doing his PT exercises when he experienced acute onset L chest pain.  Since then he has been experiencing increasing sob and increased chest pain.  Pt unable to take deep breaths d/t increased pain on inspiration.  Pt tachy in 120's.  No crepitus noted.

## 2012-06-22 NOTE — ED Notes (Signed)
PT pain decreased to 5, now increasing to 7/10.

## 2012-06-22 NOTE — ED Provider Notes (Signed)
History     CSN: [id number]  Arrival date & time 06/22/12  0127   First MD Initiated Contact with Patient 06/22/12 0448      Chief Complaint  Patient presents with  . Chest Pain    (Consider location/radiation/quality/duration/timing/severity/associated sxs/prior treatment) HPI This is a 51 year old male with lung cancer being treated at [facility]. He is here with a two-day history of left lower pleuritic chest pain. The pain is moderate to severe and sharp in nature. It is worse with deep breathing or cough. He is short of breath and finds it difficult to take a deep breath. His last chemotherapy was 2 weeks ago. He denies a fever. He is nauseated and has vomited while in the ED.  Past Medical History  Diagnosis Date  . Shortness of breath     has cold now cough  . Chronic kidney disease     kidney stones ?  Marland Kitchen Arthritis   . GERD (gastroesophageal reflux disease)   . Hypertension     Past Surgical History  Procedure Date  . Carpal tunnel release     bil  . Lumbar laminectomy 06/30/2011    Procedure: MICRODISCECTOMY LUMBAR LAMINECTOMY;  Surgeon: Eldred Manges;  Location: [facility] OR;  Service: Orthopedics;  Laterality: Left;  Left L5-S1 Microdiscectomy    No family history on file.  History  Substance Use Topics  . Smoking status: Current Every Day Smoker -- 1.5 packs/day  . Smokeless tobacco: Not on file  . Alcohol Use:       Review of Systems  All other systems reviewed and are negative.    Allergies  Review of patient's allergies indicates no known allergies.  Home Medications   Current Outpatient Rx  Name  Route  Sig  Dispense  Refill  . CHLORPROMAZINE HCL 10 MG PO TABS   Oral   Take 10 mg by mouth 3 (three) times daily as needed. nausea         . LISINOPRIL 10 MG PO TABS   Oral   Take 10 mg by mouth daily.         Marland Kitchen LORAZEPAM 0.5 MG PO TABS   Oral   Take 0.5 mg by mouth every 4 (four) hours as needed. For nausea         . ONDANSETRON HCL 8 MG PO  TABS   Oral   Take by mouth every 8 (eight) hours as needed. For nausea, use if chlorpromazine does not help         . OXYCODONE HCL ER 10 MG PO TB12   Oral   Take 20 mg by mouth every 12 (twelve) hours as needed. pain           BP 134/86  Pulse 129  Temp 97.9 F (36.6 C) (Oral)  Resp 32  SpO2 97%  Physical Exam General: Well-developed, well-nourished male in no acute distress; appearance consistent with age of record HENT: normocephalic, atraumatic Eyes: pupils equal round and reactive to light; extraocular muscles intact Neck: supple Heart: regular rate and rhythm Lungs: Decreased sounds left base; shallow breaths Abdomen: soft; nondistended; nontender; bowel sounds present Extremities: No deformity; full range of motion Neurologic: Awake, alert and oriented; motor function intact in all extremities and symmetric; no facial droop Skin: Warm and dry Psychiatric: Flat affect    ED Course  Procedures (including critical care time)     MDM   Nursing notes and vitals signs, including pulse oximetry, reviewed.  Summary of  this visit's results, reviewed by myself:  Labs:  Results for orders placed during the hospital encounter of 06/22/12 (from the past 24 hour(s))  CBC WITH DIFFERENTIAL     Status: Abnormal   Collection Time   06/22/12  1:46 AM      Component Value Range   WBC 16.6 (*) 4.0 - 10.5 K/uL   RBC 3.88 (*) 4.22 - 5.81 MIL/uL   Hemoglobin 12.1 (*) 13.0 - 17.0 g/dL   HCT 16.1 (*) 09.6 - 04.5 %   MCV 92.0  78.0 - 100.0 fL   MCH 31.2  26.0 - 34.0 pg   MCHC 33.9  30.0 - 36.0 g/dL   RDW 40.9 (*) 81.1 - 91.4 %   Platelets 213  150 - 400 K/uL   Neutrophils Relative 76  43 - 77 %   Neutro Abs 12.7 (*) 1.7 - 7.7 K/uL   Lymphocytes Relative 17  12 - 46 %   Lymphs Abs 2.9  0.7 - 4.0 K/uL   Monocytes Relative 6  3 - 12 %   Monocytes Absolute 1.0  0.1 - 1.0 K/uL   Eosinophils Relative 0  0 - 5 %   Eosinophils Absolute 0.0  0.0 - 0.7 K/uL   Basophils  Relative 0  0 - 1 %   Basophils Absolute 0.0  0.0 - 0.1 K/uL  COMPREHENSIVE METABOLIC PANEL     Status: Abnormal   Collection Time   06/22/12  1:46 AM      Component Value Range   Sodium 142  135 - 145 mEq/L   Potassium 3.4 (*) 3.5 - 5.1 mEq/L   Chloride 101  96 - 112 mEq/L   CO2 30  19 - 32 mEq/L   Glucose, Bld 189 (*) 70 - 99 mg/dL   BUN 21  6 - 23 mg/dL   Creatinine, Ser 7.82 (*) 0.50 - 1.35 mg/dL   Calcium 9.3  8.4 - 95.6 mg/dL   Total Protein 6.4  6.0 - 8.3 g/dL   Albumin 3.4 (*) 3.5 - 5.2 g/dL   AST 32  0 - 37 U/L   ALT 43  0 - 53 U/L   Alkaline Phosphatase 81  39 - 117 U/L   Total Bilirubin 0.3  0.3 - 1.2 mg/dL   GFR calc non Af Amer 44 (*) >90 mL/min   GFR calc Af Amer 51 (*) >90 mL/min  TROPONIN I     Status: Normal   Collection Time   06/22/12  1:46 AM      Component Value Range   Troponin I <0.30  <0.30 ng/mL    Imaging Studies: Dg Chest 2 View  06/22/2012  *RADIOLOGY REPORT*  Clinical Data: Left-sided chest pain and abdominal pain.  History of smoking.  CHEST - 2 VIEW  Comparison: Chest radiograph performed 06/23/2011, and CT of the chest performed 06/26/2011  Findings: The lungs are well-aerated.  A moderate left-sided pleural effusion is noted, new from prior studies, with associated airspace opacification.  This may be related to the prior lung disease characterized on CT.  There is no evidence of pneumothorax.  The heart is normal in size; the mediastinal contour is within normal limits.  No acute osseous abnormalities are seen.  IMPRESSION: New moderate left-sided pleural effusion, with associated airspace opacification.  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   Original Report Authenticated By:  Tonia Ghent, M.D.    Ct Angio Chest Pe W/cm &/or Wo Cm  06/22/2012  *RADIOLOGY REPORT*  Clinical Data: Acute chest pain; dyspnea and  shortness of breath. Status post left-sided lobectomy for lung cancer 4 months ago. History of smoking.  CT ANGIOGRAPHY CHEST  Technique:  Multidetector CT imaging of the chest using the standard protocol during bolus administration of intravenous contrast. Multiplanar reconstructed images including MIPs were obtained and reviewed to evaluate the vascular anatomy.  Contrast: 60mL OMNIPAQUE IOHEXOL 350 MG/ML SOLN  Comparison: Chest radiograph performed earlier today at 02:29 a.m., and CT of the chest performed 06/26/2011  Findings: There is no evidence of pulmonary embolus.  There is a large left-sided pleural effusion occupying most of the left hemithorax; this contains complex heterogeneous components, which could reflect either infection or a large amount of clot. Underlying mass cannot be entirely excluded.  The effusion bows out into the soft tissues of the left chest wall, between the left lateral fifth and sixth ribs.  Patchy airspace opacification is noted involving the expanded portions of the left lung, raising concern for pneumonia.  Mild patchy opacity at the right lung base may reflect atelectasis or pneumonia.  There is no evidence of pneumothorax.  The mediastinum is unremarkable in appearance, though mild rightward mediastinal shift is seen as a result of the left-sided pleural effusion.  No mediastinal lymphadenopathy is seen.  No pericardial effusion is identified.  The great vessels are unremarkable in appearance.  No axillary lymphadenopathy is seen. The visualized portions of the thyroid gland are unremarkable in appearance.  The visualized portions of the liver and spleen are unremarkable.  No acute osseous abnormalities are seen.  IMPRESSION:  1.  No evidence of pulmonary embolus. 2.  Large left-sided pleural effusion occupying most of the left hemithorax; this contains complex heterogeneous components, which could reflect either infection or a large amount of clot. Underlying mass cannot be  entirely excluded; would correlate with fluid cytology, as deemed clinically appropriate.  The effusion bows out into the soft tissues of the left chest wall, between the left lateral fifth and sixth ribs.  Associated mild rightward mediastinal shift noted.  3.  Patchy airspace opacification within the expanded portions of the left lung, and at the right lung base, raising concern for pneumonia.   Original Report Authenticated By: Tonia Ghent, M.D.    EKG Interpretation:  Date & Time: 06/22/2012 1:55 AM  Rate: 123  Rhythm: sinus tachycardia  QRS Axis: normal  Intervals: normal  ST/T Wave abnormalities: normal  Conduction Disutrbances:none  Narrative Interpretation: Poor R-wave progression  Old EKG Reviewed: Rate is significantly faster  7:18 AM TSB to admit patient.           Hanley Seamen, MD 06/22/12 [phone]

## 2012-06-22 NOTE — Consult Note (Signed)
PULMONARY  / CRITICAL CARE MEDICINE  Name: Johnny Contreras MRN: [id number] DOB: 11-Aug-1960    LOS: 0  REFERRING PROVIDER:  Blanch Media CHIEF COMPLAINT:  Chest pain  BRIEF PATIENT DESCRIPTION:  51 yo male smoker treated for lung cancer at [facility] s/p surgery and chemo admitted 06/22/2012 with Lt chest pain, and found to have left pleural effusion.  PCCM consulted 12/28 to assist with evaluation of pleural effusion.  Significant PMHx of HTN, GERD, Lung Cancer, ETOH  LINES / TUBES: PIV  CULTURES: None  ANTIBIOTICS: None  SIGNIFICANT EVENTS:  12/28 CT chest >> large Lt effusion, patchy ASD Lt lung 12/28 bedside ultrasound >> subcutaneous fluid collection Lt chest wall  History of present illness: 51 yo male dx with lung cancer last summer.  Had surgery and started on chemotherapy at [facility].  He developed sudden, severe pain in left chest two days ago which has been persistent. He presented to ED today for assessment of this.  He was noted to have pleural effusion on CXR and CT chest.  As a result pulmonary consultation was requested.  He denies cough.  It hurts for him to take a deep breath or move onto his left side.  He has swelling in his left chest.  He does not think he had fever.  He is not bringing up sputum.  PAST MEDICAL HISTORY :  Past Medical History  Diagnosis Date  . Chronic kidney disease     kidney stones ?  Marland Kitchen Arthritis   . GERD (gastroesophageal reflux disease)   . Hypertension   . Lung cancer October, 2013    Surgery and Chemo at [facility]   Past Surgical History  Procedure Date  . Carpal tunnel release     bil  . Lumbar laminectomy 06/30/2011    Procedure: MICRODISCECTOMY LUMBAR LAMINECTOMY;  Surgeon: Eldred Manges;  Location: [facility] OR;  Service: Orthopedics;  Laterality: Left;  Left L5-S1 Microdiscectomy  . Lung cancer surgery October, 2013    [facility]   Prior to Admission medications   Medication Sig Start Date End Date Taking? Authorizing Provider    chlorproMAZINE (THORAZINE) 10 MG tablet Take 10 mg by mouth 3 (three) times daily as needed. nausea   Yes Historical Provider, MD  lisinopril (PRINIVIL,ZESTRIL) 10 MG tablet Take 10 mg by mouth daily.   Yes Historical Provider, MD  LORazepam (ATIVAN) 0.5 MG tablet Take 0.5 mg by mouth every 4 (four) hours as needed. For nausea   Yes Historical Provider, MD  ondansetron (ZOFRAN) 8 MG tablet Take by mouth every 8 (eight) hours as needed. For nausea, use if chlorpromazine does not help   Yes Historical Provider, MD  oxyCODONE (OXYCONTIN) 10 MG 12 hr tablet Take 20 mg by mouth every 12 (twelve) hours as needed. pain   Yes Historical Provider, MD   No Known Allergies  FAMILY HISTORY:  No family history on file. SOCIAL HISTORY:  reports that he has been smoking.  He does not have any smokeless tobacco history on file. He reports that he drinks about 2.4 - 4.8 ounces of alcohol per week. He reports that he does not use illicit drugs.  INTERVAL HISTORY:   VITAL SIGNS: Temp:  [97.4 F (36.3 C)-98 F (36.7 C)] 98 F (36.7 C) (12/28 1052) Pulse Rate:  [109-133] 125  (12/28 1052) Resp:  [12-33] 18  (12/28 1052) BP: (104-150)/(69-107) 129/97 mmHg (12/28 1052) SpO2:  [94 %-100 %] 100 % (12/28 1052) Weight:  [173 lb 11.6 oz (78.8  kg)] 173 lb 11.6 oz (78.8 kg) (12/28 1052)  PHYSICAL EXAMINATION: General: Appears uncomfortable when he moves Neuro:  Alert, normal strength HEENT:  No sinus tenderness, no oral exudate, no LAN Cardiovascular:  s1s2 tachycardic Lungs:  Decreased breath sounds Lt base Abdomen: soft, non tender Musculoskeletal:  No edema Skin:  Surgical wound left chest with fluid collection left chest wall which is tender to palpation   Lab 06/22/12 1204 06/22/12 0146  NA -- 142  K -- 3.4*  CL -- 101  CO2 -- 30  BUN -- 21  CREATININE 2.03* 1.70*  GLUCOSE -- 189*    Lab 06/22/12 1204 06/22/12 0146  HGB 9.6* 12.1*  HCT 28.2* 35.7*  WBC 38.9* 16.6*  PLT 224 213   Dg  Chest 2 View  06/22/2012  *RADIOLOGY REPORT*  Clinical Data: Left-sided chest pain and abdominal pain.  History of smoking.  CHEST - 2 VIEW  Comparison: Chest radiograph performed 06/23/2011, and CT of the chest performed 06/26/2011  Findings: The lungs are well-aerated.  A moderate left-sided pleural effusion is noted, new from prior studies, with associated airspace opacification.  This may be related to the prior lung disease characterized on CT.  There is no evidence of pneumothorax.  The heart is normal in size; the mediastinal contour is within normal limits.  No acute osseous abnormalities are seen.  IMPRESSION: New moderate left-sided pleural effusion, with associated airspace opacification.  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   Original Report Authenticated By: Tonia Ghent, M.D.    Ct Angio Chest Pe W/cm &/or Wo Cm  06/22/2012  *RADIOLOGY REPORT*  Clinical Data: Acute chest pain; dyspnea and shortness of breath. Status post left-sided lobectomy for lung cancer 4 months ago. History of smoking.  CT ANGIOGRAPHY CHEST  Technique:  Multidetector CT imaging of the chest using the standard protocol during bolus administration of intravenous contrast. Multiplanar reconstructed images including MIPs were obtained and reviewed to evaluate the vascular anatomy.  Contrast: 60mL OMNIPAQUE IOHEXOL 350 MG/ML SOLN  Comparison: Chest radiograph performed earlier today at 02:29 a.m., and CT of the chest performed 06/26/2011  Findings: There is no evidence of pulmonary embolus.  There is a large left-sided pleural effusion occupying most of the left hemithorax; this contains complex heterogeneous components, which could reflect either infection or a large amount of clot. Underlying mass cannot be entirely excluded.  The effusion bows out into the soft tissues of the left chest wall,  between the left lateral fifth and sixth ribs.  Patchy airspace opacification is noted involving the expanded portions of the left lung, raising concern for pneumonia.  Mild patchy opacity at the right lung base may reflect atelectasis or pneumonia.  There is no evidence of pneumothorax.  The mediastinum is unremarkable in appearance, though mild rightward mediastinal shift is seen as a result of the left-sided pleural effusion.  No mediastinal lymphadenopathy is seen.  No pericardial effusion is identified.  The great vessels are unremarkable in appearance.  No axillary lymphadenopathy is seen. The visualized portions of the thyroid gland are unremarkable in appearance.  The visualized portions of the liver and spleen are unremarkable.  No acute osseous abnormalities are seen.  IMPRESSION:  1.  No evidence of pulmonary embolus. 2.  Large left-sided pleural effusion occupying most of the left hemithorax; this contains complex heterogeneous components, which could reflect either infection or a  large amount of clot. Underlying mass cannot be entirely excluded; would correlate with fluid cytology, as deemed clinically appropriate.  The effusion bows out into the soft tissues of the left chest wall, between the left lateral fifth and sixth ribs.  Associated mild rightward mediastinal shift noted.  3.  Patchy airspace opacification within the expanded portions of the left lung, and at the right lung base, raising concern for pneumonia.   Original Report Authenticated By: Tonia Ghent, M.D.     ASSESSMENT / PLAN:  A: Left sided chest pain in setting of lung cancer with left pleural effusion. P: Defer thoracentesis until left chest wall fluid collection further evaluated  A: left chest wall fluid collection ?abscess. Has SIRS criteria. P: Recommend addition of Abx >> defer to teaching service May need evaluation by surgery to assess for drainage  A: Acute renal failure P: Assessment per teaching  service.  Discussed evaluation with Dr. Gwynn Burly with IMTS.  Coralyn Helling, MD [facility] Pulmonary/Critical Care 06/22/2012, 3:28 PM Pager:  [phone] After 3pm call: [phone]

## 2012-06-22 NOTE — ED Notes (Signed)
Pt with emesis x 1 while md in room.  Stated "came out of nowhere.  I wasn't feeling nauseated before".

## 2012-06-22 NOTE — ED Notes (Signed)
PT with emesis x 1 again.  Dr Read Drivers notified and zofran ordered.

## 2012-06-22 NOTE — ED Notes (Signed)
Pt placed on neutropenic precautions

## 2012-06-22 NOTE — ED Notes (Signed)
The pt has had abd and chest pain for 3 days with vomiting Monday night.  He had a bm approx one hour ago.

## 2012-06-22 NOTE — Progress Notes (Signed)
ANTIBIOTIC CONSULT NOTE - INITIAL  Pharmacy Consult for Zosyn and Vancomycin Indication: rule out pneumonia  No Known Allergies  Patient Measurements: Height: 5' 6.93" (170 cm) Weight: 173 lb 11.6 oz (78.8 kg) IBW/kg (Calculated) : 65.94  Adjusted Body Weight:   Vital Signs: Temp: 97.7 F (36.5 C) (12/28 1638) Temp src: Oral (12/28 1638) BP: 151/99 mmHg (12/28 1638) Pulse Rate: 128  (12/28 1638) Intake/Output from previous day:   Intake/Output from this shift:    Labs:  [facility] 06/22/12 1514 06/22/12 1204 06/22/12 0146  WBC 42.4* 38.9* 16.6*  HGB 8.1* 9.6* 12.1*  PLT 215 224 213  LABCREA -- -- --  CREATININE -- 2.03* 1.70*   Estimated Creatinine Clearance: 39.2 ml/min (by C-G formula based on Cr of 2.03). No results found for this basename: VANCOTROUGH:2,VANCOPEAK:2,VANCORANDOM:2,GENTTROUGH:2,GENTPEAK:2,GENTRANDOM:2,TOBRATROUGH:2,TOBRAPEAK:2,TOBRARND:2,AMIKACINPEAK:2,AMIKACINTROU:2,AMIKACIN:2, in the last 72 hours   Microbiology: No results found for this or any previous visit (from the past 720 hour(s)).  Medical History: Past Medical History  Diagnosis Date  . Chronic kidney disease     kidney stones ?  Marland Kitchen Arthritis   . GERD (gastroesophageal reflux disease)   . Hypertension   . Lung cancer October, 2013    Surgery and Chemo at [facility]    Medications:  Scheduled:    . [COMPLETED] sodium chloride   Intravenous Once  . enoxaparin (LOVENOX) injection  40 mg Subcutaneous Q24H  . [COMPLETED] fentaNYL  50 mcg Intravenous Once  . [COMPLETED] fentaNYL  50 mcg Intravenous Once  . [COMPLETED] fentaNYL  50 mcg Intravenous Once  . [COMPLETED] fentaNYL  50 mcg Intravenous Once  . folic acid  1 mg Oral Daily  . multivitamin with minerals  1 tablet Oral Daily  . [COMPLETED] ondansetron (ZOFRAN) IV  4 mg Intravenous Once  . [COMPLETED] ondansetron (ZOFRAN) IV  4 mg Intravenous Once  . OxyCODONE  20 mg Oral Q12H  . pantoprazole  40 mg Oral Q1200  .  piperacillin-tazobactam (ZOSYN)  IV  3.375 g Intravenous Q8H  . sodium chloride  3 mL Intravenous Q12H  . thiamine  100 mg Oral Daily  . vancomycin  1,000 mg Intravenous Q24H  . [DISCONTINUED] lisinopril  10 mg Oral Daily  . [DISCONTINUED] pantoprazole (PROTONIX) IV  40 mg Intravenous Q24H  . [DISCONTINUED] piperacillin-tazobactam  3.375 g Intravenous Q8H  . [DISCONTINUED] general admission iv infusion   Intravenous Once   Assessment: 51 yr old male being treated for lung cancer at [facility] admitted with lt chest pain and left pleural effusion. CrCl 39 ml/min. Goal of Therapy:  Vancomycin trough level 15-20 mcg/ml  Plan:  1) Zosyn 3.375 Gm IV q8h (4 hr infusion) 2) Vancomycin 1 Gm IV q24 hrs 3) Vancomycin trough when appropriate.  Eugene Garnet 06/22/2012,4:53 PM

## 2012-06-22 NOTE — Progress Notes (Signed)
Internal Medicine Teaching Service Attending Note Date: 06/22/2012  Patient name: Johnny Contreras  Medical record number: [id number]  Date of birth: 1960-08-18   I have seen and evaluated Ellamae Sia and discussed their care with the Residency Team. Please see Dr Philis Pique H&P for full details. Mr Geraci receives medical care at [facility] for lung cancer, details unknown. He has had surgery and chemo. He had increasing pain in the left side of the chest and LU abd for 2 weeks but presented 2/2 sig increased pain past 2 days. He also endorses dyspnea. The fentanyl that he got in the ER was decreased the pain but was worn off. He states he has percocet at home but hasn't taken recently.   PMHx, meds, allergies, soc hx, fam hx, and ROS were reviewed.  Vitals reviewed and sig for tachyardia Gen : Lying in bed. Appears in pain. Breathing normally.  H tachy, no rhythm, no murmur L decrease air movment in L base ABD + BS, soft but quite tender LUQ and epigastrium Ext no edema Neuro : alert and oriented. No focal  Labs & imaging reviewed an pertinent for K 3.4, Glucose 189, Alb 3.4, WBC 16.6, plts 213, HgB 12.7  CT chest : large L pleural effusion, encroaches on soft tissues of L chest. Mild R mediastinal shift  EKG poor baseline, sinus tach, no acute ischemic changes  Assessment and Plan: I agree with the formulated Assessment and Plan with the following changes:   1. L large pleural effusion - in setting of known lung cancer. Request pul consult for thoracentesis / chest tube for symptom relief and dx. Pain control with opioids - but I was unaware that pt was oversedated in ER so will be fine line.   2. Chest pain - likely 2/2 #1 but Dr Earlene Plater appropriately tx with PPI.   3. AKI - hold lisinopril. Creatinine will likely rise 2/2 IV contrast for Ct scan. Hydrate and follow. Cause is likely pre-renal 2/2 vol contraction from decreased PO intake but could also be intra-renal 2/2 chemo agents  wlthough we do not know what he is on.  4. HTN - hold ACEi  5. Dispo - will contact Dr Allyson Sabal @ [facility]. Will depend on pul recs.   Burns Spain, MD 12/28/201312:07 PM

## 2012-06-22 NOTE — ED Notes (Signed)
Dr. Molpus at bedside. 

## 2012-06-22 NOTE — Discharge Summary (Signed)
Patient Name:  Johnny Contreras MRN: [id number]  PCP: No primary provider on file. DOB:  1960/11/22       Date of Admission:  06/22/2012  Date of Discharge:  06/22/2012      Attending Physician: Burns Spain, MD      DISCHARGE DIAGNOSES: 1.   Chest wall pain 2.   Lung cancer 3.   Pleural effusion 4.   Hypertension 5.   Acute kidney injury 6.   Alcohol abuse   DISPOSITION AND FOLLOW-UP: [name] was transferred to [facility] to the thoracic oncology service   DISCHARGE MEDICATIONS:   Medication List     As of 06/22/2012  5:02 PM    ASK your doctor about these medications         chlorproMAZINE 10 MG tablet   Commonly known as: THORAZINE   Take 10 mg by mouth 3 (three) times daily as needed. nausea      lisinopril 10 MG tablet   Commonly known as: PRINIVIL,ZESTRIL   Take 10 mg by mouth daily.      LORazepam 0.5 MG tablet   Commonly known as: ATIVAN   Take 0.5 mg by mouth every 4 (four) hours as needed. For nausea      ondansetron 8 MG tablet   Commonly known as: ZOFRAN   Take by mouth every 8 (eight) hours as needed. For nausea, use if chlorpromazine does not help      oxyCODONE 10 MG 12 hr tablet   Commonly known as: OXYCONTIN   Take 20 mg by mouth every 12 (twelve) hours as needed. pain         CONSULTS:  1.   Pulmonology   PROCEDURES PERFORMED:  Dg Chest 2 View 06/22/2012 FINDINGS: The lungs are well-aerated.  A moderate left-sided pleural effusion is noted, new from prior studies, with associated airspace opacification.  This may be related to the prior lung disease characterized on CT.  There is no evidence of pneumothorax.  The heart is normal in size; the mediastinal contour is within normal limits.  No acute osseous abnormalities are seen. IMPRESSION: New moderate left-sided pleural effusion, with associated airspace opacification.  Would correlate as to the tissue diagnosis from the prior cystic lung disease  characterized on CT, on whether this reflects infectious, inflammatory or malignant etiology.  Ct Angio Chest Pe W/cm &/or Wo Cm 06/22/2012  FINDINGS: There is no evidence of pulmonary embolus.  There is a large left-sided pleural effusion occupying most of the left hemithorax; this contains complex heterogeneous components, which could reflect either infection or a large amount of clot. Underlying mass cannot be entirely excluded.  The effusion bows out into the soft tissues of the left chest wall, between the left lateral fifth and sixth ribs.  Patchy airspace opacification is noted involving the expanded portions of the left lung, raising concern for pneumonia.  Mild patchy opacity at the right lung base may reflect atelectasis or pneumonia.  There is no evidence of pneumothorax.  The mediastinum is unremarkable in appearance, though mild rightward mediastinal shift is seen as a result of the left-sided pleural effusion.  No mediastinal lymphadenopathy is seen.  No pericardial effusion is identified.  The great vessels are unremarkable in appearance.  No axillary lymphadenopathy is seen. The visualized portions of the thyroid gland are unremarkable in appearance.  The visualized portions of the liver and spleen are unremarkable.  No acute osseous abnormalities are seen.  IMPRESSION:  1.  No evidence of pulmonary embolus. 2.  Large left-sided pleural effusion occupying most of the left hemithorax; this contains complex heterogeneous components, which could reflect either infection or a large amount of clot. Underlying mass cannot be entirely excluded; would correlate with fluid cytology, as deemed clinically appropriate.  The effusion bows out into the soft tissues of the left chest wall, between the left lateral fifth and sixth ribs.  Associated mild rightward mediastinal shift noted.  3.  Patchy airspace opacification within the expanded portions of the left lung, and at the right lung base, raising concern  for pneumonia.    ADMISSION DATA: H&P: This is a 51 year old man with lung cancer diagnosed this past summer. He has received operative treatment at [facility] and is currently undergoing chemotherapy. He has received three rounds of an unknown regimen and is scheduled for a fourth and final round Monday. Over the past two weeks, he has been experiencing a pain in his left hemithorax, but this worsened acutely two days ago. This morning it became severe enough that he presented with his wife to the ED. The pain is sharp in quality. It is located over the left lower hemithorax and left upper quadrant of the abdomen without radiation. The pain is worsened with food intake and was breathing. Nothing seems to make this pain better. The pain is not associated with any other symptoms; in particular, this pain is not associated with shortness of breath or increased work of breathing. He denies systemic symptoms; including dizziness, headaches, nausea, vomiting, fevers, chills, cough, and cold. He does say that he fluctuates between constipation and diarrhea. His last bowel movement was yesterday and was well-formed.  Physical Exam: VITALS: Temp 36.7, RR 18, HR 125, BP 129/97 GENERAL: well developed, well nourished; sleepy; no acute distress  HEAD: atraumatic, normocephalic  EYES: pupils equal, round and reactive; sclera anicteric; normal conjunctiva  EARS: canals patent and TMs normal bilaterally  NOSE/THROAT: oropharynx clear, moist mucous membranes, pink gums, several teeth missing  NECK: supple, no carotid bruits, thyroid normal in size and without palpable nodules  LYMPH: no cervical or supraclavicular lymphadenopathy  LUNGS: Reasonably good air movement in all lung fields with some expiratory bronchial sounds, dullness to percussion in the left hemithorax, normal work of breathing  HEART: normal rate and regular rhythm; normal S1 and S2 without S3 or S4; no murmurs, rubs, or clicks  PULSES: radial and  dorsalis pedis 2+ and symmetric  ABDOMEN: soft, tenderness to palpation of the left upper quadrant and epigastrium, normal bowel sounds, no masses, spleen and liver are non-palpable  MOTOR: plantarflexion, dorsiflexion, hip extension, and handgrip strength are intact  REFLEXES: patellar reflexes are absent  SKIN: warm, dry, intact, normal turgor, no rashes  EXTREMITIES: no peripheral edema, clubbing appreciated in the fingers and toes bilaterally   Labs: Basic Metabolic Panel:  Basename  06/22/12 0146   NA  142   K  3.4*   CL  101   CO2  30   GLUCOSE  189*   BUN  21   CREATININE  1.70*   CALCIUM  9.3   MG  --   PHOS  --    Liver Function Tests:  [facility]  06/22/12 0146   AST  32   ALT  43   ALKPHOS  81   BILITOT  0.3   PROT  6.4   ALBUMIN  3.4*    CBC:  Basename  06/22/12 0146   WBC  16.6*   NEUTROABS  12.7*   HGB  12.1*   HCT  35.7*   MCV  92.0   PLT  213    Cardiac Enzymes:  Basename  06/22/12 0146   CKTOTAL  --   CKMB  --   CKMBINDEX  --   TROPONINI  <0.30     HOSPITAL COURSE:  Admission labs, almost 12 hours after initial presentation to the ED, demonstrated a dramatic jump in leukocytosis from 16.6 to 38.9.  This was confirmed with a repeat CBC 3 hours later where the leukocyte count had further increased to 42.4. Meanwhile, the hemoglobin that was 12.1 at initial presentation had dropped to 9.6 and then to 8.1. Hemodynamically, the patient remained stable but tachycardic with blood pressures within the normal range and heart rates ranging from the 120s to 130s. Oxygen saturation remained normal without supplemental oxygen, and respiratory rate remained normal. Pulmonology was consulted regarding possible thoracentesis, but because of the fluctuant mass along the posterior axillary line the pulmonologist did not feel comfortable introducing the needle into the pleural space. Radiology was again consulted, and they felt that this soft tissue mass could represent  tumor invasion between the ribs. Blood cultures were obtained and broad-spectrum antibiotics were initiated with Zosyn and vancomycin. Dr. Chestine Spore, the surgeon covering for the patient's surgeon, Dr. Allyson Sabal, at [facility] was contacted and [facility].   DISCHARGE DATA: Vital Signs: BP 151/99  Pulse 128  Temp 97.7 F (36.5 C) (Oral)  Resp 24  Ht 5' 6.93" (1.7 m)  Wt 173 lb 11.6 oz (78.8 kg)  BMI 27.27 kg/m2  SpO2 97%  Labs: Results for orders placed during the hospital encounter of 06/22/12 (from the past 24 hour(s))  CBC WITH DIFFERENTIAL     Status: Abnormal   Collection Time   06/22/12  1:46 AM      Component Value Range   WBC 16.6 (*) 4.0 - 10.5 K/uL   RBC 3.88 (*) 4.22 - 5.81 MIL/uL   Hemoglobin 12.1 (*) 13.0 - 17.0 g/dL   HCT 45.4 (*) 09.8 - 11.9 %   MCV 92.0  78.0 - 100.0 fL   MCH 31.2  26.0 - 34.0 pg   MCHC 33.9  30.0 - 36.0 g/dL   RDW 14.7 (*) 82.9 - 56.2 %   Platelets 213  150 - 400 K/uL   Neutrophils Relative 76  43 - 77 %   Neutro Abs 12.7 (*) 1.7 - 7.7 K/uL   Lymphocytes Relative 17  12 - 46 %   Lymphs Abs 2.9  0.7 - 4.0 K/uL   Monocytes Relative 6  3 - 12 %   Monocytes Absolute 1.0  0.1 - 1.0 K/uL   Eosinophils Relative 0  0 - 5 %   Eosinophils Absolute 0.0  0.0 - 0.7 K/uL   Basophils Relative 0  0 - 1 %   Basophils Absolute 0.0  0.0 - 0.1 K/uL  COMPREHENSIVE METABOLIC PANEL     Status: Abnormal   Collection Time   06/22/12  1:46 AM      Component Value Range   Sodium 142  135 - 145 mEq/L   Potassium 3.4 (*) 3.5 - 5.1 mEq/L   Chloride 101  96 - 112 mEq/L   CO2 30  19 - 32 mEq/L   Glucose, Bld 189 (*) 70 - 99 mg/dL   BUN 21  6 - 23 mg/dL   Creatinine,  Ser 1.70 (*) 0.50 - 1.35 mg/dL   Calcium 9.3  8.4 - 29.5 mg/dL   Total Protein 6.4  6.0 - 8.3 g/dL   Albumin 3.4 (*) 3.5 - 5.2 g/dL   AST 32  0 - 37 U/L   ALT 43  0 - 53 U/L   Alkaline Phosphatase 81  39 - 117 U/L   Total Bilirubin 0.3  0.3 - 1.2  mg/dL   GFR calc non Af Amer 44 (*) >90 mL/min   GFR calc Af Amer 51 (*) >90 mL/min  TROPONIN I     Status: Normal   Collection Time   06/22/12  1:46 AM      Component Value Range   Troponin I <0.30  <0.30 ng/mL  CBC     Status: Abnormal   Collection Time   06/22/12 12:04 PM      Component Value Range   WBC 38.9 (*) 4.0 - 10.5 K/uL   RBC 3.06 (*) 4.22 - 5.81 MIL/uL   Hemoglobin 9.6 (*) 13.0 - 17.0 g/dL   HCT 62.1 (*) 30.8 - 65.7 %   MCV 92.2  78.0 - 100.0 fL   MCH 31.4  26.0 - 34.0 pg   MCHC 34.0  30.0 - 36.0 g/dL   RDW 84.6 (*) 96.2 - 95.2 %   Platelets 224  150 - 400 K/uL  CREATININE, SERUM     Status: Abnormal   Collection Time   06/22/12 12:04 PM      Component Value Range   Creatinine, Ser 2.03 (*) 0.50 - 1.35 mg/dL   GFR calc non Af Amer 36 (*) >90 mL/min   GFR calc Af Amer 41 (*) >90 mL/min  CBC     Status: Abnormal   Collection Time   06/22/12  3:14 PM      Component Value Range   WBC 42.4 (*) 4.0 - 10.5 K/uL   RBC 2.59 (*) 4.22 - 5.81 MIL/uL   Hemoglobin 8.1 (*) 13.0 - 17.0 g/dL   HCT 84.1 (*) 32.4 - 40.1 %   MCV 92.7  78.0 - 100.0 fL   MCH 31.3  26.0 - 34.0 pg   MCHC 33.8  30.0 - 36.0 g/dL   RDW 02.7 (*) 25.3 - 66.4 %   Platelets 215  150 - 400 K/uL     Time spent on discharge: 35 minutes   Signed by:  Dorthula Rue. Earlene Plater, MD PGY-I, Internal Medicine  06/22/2012, 5:02 PM

## 2012-06-22 NOTE — ED Notes (Signed)
The pt has been diagnosed with lung cancer approx 5 months ago and had a portion on his lt lung removed.  He has been getting chemotherapy.  He does not have a porta-cath or a hickman

## 2012-06-22 NOTE — ED Notes (Signed)
Called CT and they stated transport is on the way to pick up pt.

## 2012-06-22 NOTE — ED Notes (Signed)
Admitting MD at bedside.

## 2012-06-22 NOTE — H&P (Signed)
Hospital Admission Note Date: 06/22/2012  Patient name: Johnny Contreras Medical record number: [id number] Date of birth: July 26, 1960 Age: 51 y.o. Gender: male PCP: No primary provider on file.   Service:  Internal Medicine Teaching Service  Attending Physician:  Dr. Blanch Media  First Contact:  Dr. Earlene Plater   Pager:  [phone] Second Contact:  Dr. Bosie Clos Pager: [phone]     After 5PM, weekends, and holidays: First Contact:              Pager: [phone] Second Contact:         Pager: [phone]   Chief Complaint:  Chest and abdominal pain    History of Present Illness:  This is a 51 year old man with lung cancer diagnosed this past summer. He has received operative treatment at [facility] and is currently undergoing chemotherapy.  He has received three rounds of an unknown regimen and is scheduled for a fourth and final round Monday.  Over the past two weeks, he has been experiencing a pain in his left hemithorax, but this worsened acutely two days ago.  This morning it became severe enough that he presented with his wife to the ED. The pain is sharp in quality. It is located over the left lower hemithorax and left upper quadrant of the abdomen without radiation. The pain is worsened with food intake and was breathing. Nothing seems to make this pain better. The pain is not associated with any other symptoms; in particular, this pain is not associated with shortness of breath or increased work of breathing.  He denies systemic symptoms; including dizziness, headaches, nausea, vomiting, fevers, chills, cough, and cold. He does say that he fluctuates between constipation and diarrhea. His last bowel movement was yesterday and was well-formed.     Review of Systems:   Constitutional: Negative.  Negative for fever and chills.  HENT: Negative.   Respiratory: Negative.  Negative for cough and shortness of breath.   Cardiovascular: Negative.   Gastrointestinal: Positive for abdominal pain, diarrhea  and constipation. Negative for nausea, vomiting and blood in stool.  Skin: Negative.  Negative for rash.  Neurological: Negative.  Negative for dizziness and headaches.     Medical History: Past Medical History  Diagnosis Date  . Chronic kidney disease     kidney stones ?  Marland Kitchen Arthritis   . GERD (gastroesophageal reflux disease)   . Hypertension   . Lung cancer October, 2013    Surgery and Chemo at [facility]    Surgical History: Past Surgical History  Procedure Date  . Carpal tunnel release     bil  . Lumbar laminectomy 06/30/2011    Procedure: MICRODISCECTOMY LUMBAR LAMINECTOMY;  Surgeon: Eldred Manges;  Location: [facility] OR;  Service: Orthopedics;  Laterality: Left;  Left L5-S1 Microdiscectomy  . Lung cancer surgery October, 2013    [facility]    Home Medications: Current Outpatient Rx  Name  Route  Sig  Dispense  Refill  . CHLORPROMAZINE HCL 10 MG PO TABS   Oral   Take 10 mg by mouth 3 (three) times daily as needed. nausea         . LISINOPRIL 10 MG PO TABS   Oral   Take 10 mg by mouth daily.         Marland Kitchen LORAZEPAM 0.5 MG PO TABS   Oral   Take 0.5 mg by mouth every 4 (four) hours as needed. For nausea         . ONDANSETRON HCL 8  MG PO TABS   Oral   Take by mouth every 8 (eight) hours as needed. For nausea, use if chlorpromazine does not help         . OXYCODONE HCL ER 10 MG PO TB12   Oral   Take 20 mg by mouth every 12 (twelve) hours as needed. pain           Allergies: Allergies as of 06/22/2012  . (No Known Allergies)    Family History: No family history on file.  Social History: Social History  . Marital Status: Married   Social History Main Topics  . Smoking status: Current Every Day Smoker -- 1.5 packs/day  . Alcohol Use: Heavy use of liquor and beer  . Drug Use: No    Physical exam: GENERAL: well developed, well nourished; sleepy; no acute distress HEAD: atraumatic, normocephalic EYES: pupils equal, round and reactive; sclera anicteric; normal  conjunctiva EARS: canals patent and TMs normal bilaterally NOSE/THROAT: oropharynx clear, moist mucous membranes, pink gums, several teeth missing NECK: supple, no carotid bruits, thyroid normal in size and without palpable nodules LYMPH: no cervical or supraclavicular lymphadenopathy LUNGS: Reasonably good air movement in all lung fields with some expiratory bronchial sounds, dullness to percussion in the left hemithorax, normal work of breathing HEART: normal rate and regular rhythm; normal S1 and S2 without S3 or S4; no murmurs, rubs, or clicks PULSES: radial and dorsalis pedis 2+ and symmetric ABDOMEN: soft, tenderness to palpation of the left upper quadrant and epigastrium, normal bowel sounds, no masses, spleen and liver are non-palpable MOTOR: plantarflexion, dorsiflexion, hip extension, and handgrip strength are intact REFLEXES: patellar reflexes are absent SKIN: warm, dry, intact, normal turgor, no rashes EXTREMITIES: no peripheral edema, clubbing appreciated in the fingers and toes bilaterally     Lab results: Basic Metabolic Panel:  Basename 06/22/12 0146  NA 142  K 3.4*  CL 101  CO2 30  GLUCOSE 189*  BUN 21  CREATININE 1.70*  CALCIUM 9.3  MG --  PHOS --   Liver Function Tests:  [facility] 06/22/12 0146  AST 32  ALT 43  ALKPHOS 81  BILITOT 0.3  PROT 6.4  ALBUMIN 3.4*   CBC:  Basename 06/22/12 0146  WBC 16.6*  NEUTROABS 12.7*  HGB 12.1*  HCT 35.7*  MCV 92.0  PLT 213   Cardiac Enzymes:  Basename 06/22/12 0146  CKTOTAL --  CKMB --  CKMBINDEX --  TROPONINI <0.30    Imaging results: Dg Chest 2 View 06/22/2012  FINDINGS: The lungs are well-aerated.  A moderate left-sided pleural effusion is noted, new from prior studies, with associated airspace opacification.  This may be related to the prior lung disease characterized on CT.  There is no evidence of pneumothorax.  The heart is normal in size; the mediastinal contour is within normal limits.  No acute  osseous abnormalities are seen.   IMPRESSION: New moderate left-sided pleural effusion, with associated airspace opacification.  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  Ct Angio Chest Pe W/cm &/or Wo Cm 06/22/2012   FINDINGS: There is no evidence of pulmonary embolus.  There is a large left-sided pleural effusion occupying most of the left hemithorax; this contains complex heterogeneous components, which could reflect either infection or a large amount of clot. Underlying mass cannot be entirely excluded.  The effusion bows out into the soft tissues of the left chest  wall, between the left lateral fifth and sixth ribs.  Patchy airspace opacification is noted involving the expanded portions of the left lung, raising concern for pneumonia.  Mild patchy opacity at the right lung base may reflect atelectasis or pneumonia.  There is no evidence of pneumothorax.  The mediastinum is unremarkable in appearance, though mild rightward mediastinal shift is seen as a result of the left-sided pleural effusion.  No mediastinal lymphadenopathy is seen.  No pericardial effusion is identified.  The great vessels are unremarkable in appearance.  No axillary lymphadenopathy is seen. The visualized portions of the thyroid gland are unremarkable in appearance.  The visualized portions of the liver and spleen are unremarkable.  No acute osseous abnormalities are seen.  IMPRESSION:  1.  No evidence of pulmonary embolus. 2.  Large left-sided pleural effusion occupying most of the left hemithorax; this contains complex heterogeneous components, which could reflect either infection or a large amount of clot. Underlying mass cannot be entirely excluded; would correlate with fluid cytology, as deemed clinically appropriate.  The effusion bows out into the soft tissues of the left chest wall, between the  left lateral fifth and sixth ribs.  Associated mild rightward mediastinal shift noted.  3.  Patchy airspace opacification within the expanded portions of the left lung, and at the right lung base, raising concern for pneumonia.    Other results: EKG Results:  06/22/2012 Rate:  123 PR:  112 QRS:  76 QTc:  449 EKG: normal EKG, normal sinus rhythm, sinus tachycardia.   Assessment and Plan:  1.   Chest pain:  The most likely etiology of this chest pain is pleurisy, rising from a perineoplastic pleural effusion.  There is some question of airspace opacity on chest CT, raising the concern for a peripneumonic effusion the patient denies symptoms of pneumonia such as cough, fever, and chills. He does, however, have a leukocytosis of 16.6. He is at risk for thromboembolic disease, but the chest CT angiogram effectively rules out pulmonary embolism as the cause of his chest pain. The nature of this pain, a reassuring EKG, and negative troponins makes cardiac etiology is unlikely. The abdominal pain is likely related to this, a history of GERD raises the concern for peptic ulcer disease. He was not on a PPI prior to admission; we will start him pantoprazole here. We will keep him on his home analgesic and antiemetic therapy, and we will consult pulmonology to evaluate this patient for a diagnostic and/or therapeutic thoracentesis.  - Pulmonology consultation  - Pantoprazole 40 mg daily  - Continue home OxyContin 20 mg twice a day  - Oxycodone-acetaminophen 5-325 every 4 hours for breakthrough pain - Continue home ondansetron 8 mg every 8 hours and chlorpromazine 10mg  TID as needed for nausea  2.   Lung Cancer:  His history is gathered entirely from the wife's report. We do not have a tissue diagnosis or the exact chemotherapy regimen he is on. His pain and pleural effusion are likely sequelae of this disease, and we will treat as described in #1. We will try to obtain records from [facility].   3.    Hypertension:  Some elevated pressures in the emergency department. We will hold his home lisinopril in the setting of acute kidney injury. We will continue to monitor his blood pressures and assess the need for antihypertensive therapy going forward.  - Hold home lisinopril 10 mg daily   4.   Acute kidney injury:  Creatinine one year ago was 0.81.  Today it is elevated at 1.7. This may be a prerenal insult, stemming from poor intake with the chest pain. Alternatively, a chemotherapeutic he is on could be causing kidney injury. We will hydrate with IV fluids and monitor his kidney function. In the meantime, we will hold his lisinopril.   5.   Alcohol abuse:  The wife reports he drinks several alcoholic drinks a day including beer and liquor. He has never had an alcohol withdrawal seizure. He also takes lorazepam 0.5 mg every 4 hours as needed at home for anxiety. We will give him thiamine and place him on CIWA protocol.  6.   Disposition:  His only doctor is Dr. Allyson Sabal of [facility] who is treating him for this lung cancer. He does not have a primary care physician. Transportation is not an issue, his wife can drive him. We will try to arrange for primary care followup following this hospitalization.     Signed by:  Dorthula Rue. Earlene Plater, MD PGY-I, Internal Medicine  06/22/2012, 8:25 AM

## 2012-06-22 NOTE — ED Notes (Signed)
Floor RN unable to take report at the time. To call back. 

## 2012-06-23 ENCOUNTER — Encounter (HOSPITAL_COMMUNITY): Payer: Self-pay

## 2012-06-23 NOTE — Plan of Care (Signed)
Problem: Discharge Progression Outcomes Goal: Discharge plan in place and appropriate Outcome: Completed/Met Date Met:  06/23/12 Pt had some diagnostics done.  It was deemed necessary for pt to be transferred to [facility] where he has been receiving treatment for lung cancer to better manage pt's acute condition for better continuity of care.

## 2012-06-28 LAB — CULTURE, BLOOD (ROUTINE X 2): Culture: NO GROWTH

## 2014-02-20 ENCOUNTER — Ambulatory Visit: Payer: Medicare Other | Admitting: Podiatry

## 2014-02-25 ENCOUNTER — Ambulatory Visit: Payer: Medicare Other | Admitting: Podiatry

## 2014-02-25 ENCOUNTER — Encounter: Payer: Self-pay | Admitting: Podiatry

## 2014-02-25 VITALS — BP 152/86 | HR 92 | Resp 16

## 2014-02-25 DIAGNOSIS — L600 Ingrowing nail: Secondary | ICD-10-CM

## 2014-02-25 NOTE — Patient Instructions (Addendum)

## 2014-02-25 NOTE — Progress Notes (Signed)
Subjective:     Patient ID: Johnny Contreras, male   DOB: 1960-10-02, 53 y.o.   MRN: [id number]  Toe Pain    patient states the medial borders of both my big toenails are bothering me and I cannot wear shoe gear comfortably and may need to get them fixed.   Review of Systems  All other systems reviewed and are negative.      Objective:   Physical Exam  Nursing note and vitals reviewed. Constitutional: He is oriented to person, place, and time.  Cardiovascular: Intact distal pulses.   Musculoskeletal: Normal range of motion.  Neurological: He is oriented to person, place, and time.  Skin: Skin is warm.   neurovascular status intact with muscle strength adequate and range of motion subtalar midtarsal joint within normal limits. Patient's found to have good digital perfusion good arch height and is well oriented x3 and is noted to have incurvated hallux nails medial border of both feet     Assessment:     Chronic ingrown toenail deformity hallux both the medial border    Plan:     H&P and reviewed condition. I've recommended removal of the corners and I explained procedure and today I infiltrated 60 mg Xylocaine Marcaine mixture removed each medial corner exposed matrix and apply chemical phenol 3 applications followed by alcohol lavaged and sterile dressing. Gave instructions on soaks and reappoint

## 2014-02-25 NOTE — Progress Notes (Signed)
   Subjective:    Patient ID: Johnny Contreras, male    DOB: [birth year], 53 y.o.   MRN: [id number]  HPI Comments: "I have ingrowns"  Patient c/o aching 1st toes bilateral, both borders on each, left over right, for about 6 months. The left medial border is swollen and discolored. He has been soaking and trying to trim out-no better.     Review of Systems  Constitutional: Positive for fatigue.  Eyes: Positive for redness.  Respiratory: Positive for apnea, cough, chest tightness and shortness of breath.   Cardiovascular: Positive for chest pain and leg swelling.  Gastrointestinal: Positive for abdominal pain and constipation.  Endocrine: Positive for cold intolerance.  Musculoskeletal: Positive for arthralgias, back pain, gait problem and myalgias.  Skin: Positive for rash.  Neurological: Positive for weakness, numbness and headaches.  Psychiatric/Behavioral: The patient is nervous/anxious.   All other systems reviewed and are negative.      Objective:   Physical Exam        Assessment & Plan:

## 2014-11-04 ENCOUNTER — Ambulatory Visit: Payer: Medicare Other | Attending: Orthopaedic Surgery

## 2014-11-04 DIAGNOSIS — M545 Low back pain: Secondary | ICD-10-CM | POA: Insufficient documentation

## 2014-11-04 DIAGNOSIS — M2560 Stiffness of unspecified joint, not elsewhere classified: Secondary | ICD-10-CM | POA: Insufficient documentation

## 2014-11-04 DIAGNOSIS — R6889 Other general symptoms and signs: Secondary | ICD-10-CM | POA: Insufficient documentation

## 2014-11-04 DIAGNOSIS — R202 Paresthesia of skin: Secondary | ICD-10-CM | POA: Insufficient documentation

## 2014-11-04 DIAGNOSIS — R293 Abnormal posture: Secondary | ICD-10-CM | POA: Insufficient documentation

## 2014-11-12 ENCOUNTER — Ambulatory Visit: Payer: Medicare Other

## 2014-11-12 DIAGNOSIS — R6889 Other general symptoms and signs: Secondary | ICD-10-CM | POA: Diagnosis not present

## 2014-11-12 DIAGNOSIS — R202 Paresthesia of skin: Secondary | ICD-10-CM

## 2014-11-12 DIAGNOSIS — M2560 Stiffness of unspecified joint, not elsewhere classified: Secondary | ICD-10-CM

## 2014-11-12 DIAGNOSIS — R293 Abnormal posture: Secondary | ICD-10-CM

## 2014-11-12 DIAGNOSIS — M545 Low back pain: Secondary | ICD-10-CM | POA: Diagnosis not present

## 2014-11-12 DIAGNOSIS — R200 Anesthesia of skin: Secondary | ICD-10-CM

## 2014-11-12 NOTE — Patient Instructions (Signed)
Discussed POC and what PT is all about with movement and exercise and use of modalities. He agreed to try.

## 2014-11-12 NOTE — Therapy (Addendum)
[place], [place], [address] Phone: [phone]   Fax:  [phone]  Physical Therapy Evaluation  Patient Details  Name: Johnny Contreras MRN: [id number] Date of Birth: 01-15-1961 Referring Provider:  Marybelle Killings, MD  Encounter Date: 11/12/2014      PT End of Session - 11/12/14 1549    Visit Number 1   Number of Visits 12   Date for PT Re-Evaluation 12/24/14   Authorization Type Medicare   Authorization Time Period 6 weeks   Authorization - Visit Number 1   Authorization - Number of Visits 12   PT Start Time 0300   PT Stop Time 0340   PT Time Calculation (min) 40 min   Activity Tolerance Patient limited by pain   Behavior During Therapy [facility] for tasks assessed/performed      Past Medical History  Diagnosis Date  . Chronic kidney disease     kidney stones ?  Marland Kitchen Arthritis   . GERD (gastroesophageal reflux disease)   . Hypertension   . Lung cancer October, 2013    Surgery and Chemo at [facility]    Past Surgical History  Procedure Laterality Date  . Carpal tunnel release      bil  . Lumbar laminectomy  06/30/2011    Procedure: MICRODISCECTOMY LUMBAR LAMINECTOMY;  Surgeon: Marybelle Killings;  Location: [place];  Service: Orthopedics;  Laterality: Left;  Left L5-S1 Microdiscectomy  . Lung cancer surgery  October, 2013    [facility]    There were no vitals filed for this visit.  Visit Diagnosis:  Bilateral low back pain, with sciatica presence unspecified - Plan: PT plan of care cert/re-cert  Joint stiffness of spine - Plan: PT plan of care cert/re-cert  Abnormal posture - Plan: PT plan of care cert/re-cert  Activity intolerance - Plan: PT plan of care cert/re-cert  Numbness and tingling of left leg - Plan: PT plan of care cert/re-cert      Subjective Assessment - 11/12/14 1505    Subjective He reports constant pain in back and radiating LT leg to inside of LT leg to foot.  MD report OA and need for   fusion. Can't do fusion due to decrreased lung tissue.    Pertinent History He reports pain since 2003.  Lumbar disc surgery 2013. This did not help much.    Limitations --  Can't do nothing   How long can you sit comfortably? with legs on pillow didn't time him self   How long can you stand comfortably? 5 min   How long can you walk comfortably? limited with SOB   Diagnostic tests MRI Degeneration in discs   Currently in Pain? Yes   Pain Score 8    Pain Location Back   Pain Orientation Lower   Pain Descriptors / Indicators Constant;Numbness  pressure in leg and foot   Pain Type Chronic pain   Pain Radiating Towards LT leg   Pain Onset More than a month ago   Pain Frequency Constant   Aggravating Factors  All activity and postions   Pain Relieving Factors nothing   Multiple Pain Sites No            [facility] PT Assessment - 11/12/14 1501    Assessment   Medical Diagnosis low back pain   Onset Date --  surgery 06/2011   Next MD Visit next month   Prior Therapy before surgery   Precautions   Precautions None  Restrictions   Weight Bearing Restrictions No   Balance Screen   Has the patient fallen in the past 6 months Yes   How many times? 1  tripped on dog   Prior Function   Level of Independence Independent with basic ADLs   Cognition   Overall Cognitive Status Within Functional Limits for tasks assessed   Posture/Postural Control   Posture Comments RT shoulder lower than LT   ROM / Strength   AROM / PROM / Strength AROM;Strength   AROM   AROM Assessment Site Lumbar   Lumbar Flexion he can touch his dital thighs He initially declined "cause it hurts"     Lumbar Extension no extesnion . He initally declined "cause it hurts"    Lumbar - Right Side Bend ,10 degres   Lumbar - Left Side Bend < 5 degrees  in sitting when asked to rotate he side flex 10-15 degrees   Lumbar - Right Rotation 10   Lumbar - Left Rotation 10   Strength   Overall Strength Comments When  distracted he gave good resistnace in RT and LT leg .    Ambulation/Gait   Gait Comments WFL    FOTO 78%                       PT Education - 11/12/14 1548    Education provided Yes   Education Details POC , Focus of PT treatment   Person(s) Educated Patient   Methods Explanation   Comprehension Verbalized understanding          PT Short Term Goals - 11/12/14 1554    PT SHORT TERM GOAL #1   Title He will report 20% decr pain and able to sit for 30 min without standing.    Time 3   Period Weeks   Status New   PT SHORT TERM GOAL #2   Title He will be able to do inital HEP  correctly   Time 3   Period Weeks   Status New           PT Long Term Goals - 11/12/14 1556    PT LONG TERM GOAL #1   Title he will be able to do all HEP issued as of last visit   Time 6   Period Weeks   Status New   PT LONG TERM GOAL #2   Title He will report back pain improved 40-50% with normal home tasks   Time 6   Period Weeks   Status New   PT LONG TERM GOAL #3   Title He will report leg symptoms decreaed 30% or more  to increase ability to stand on LT leg with  50-75% or weight   Time 6   Period Weeks   Status New   PT LONG TERM GOAL #4   Title FOTO score improved to < 60%   Time 6   Period Weeks   Status New               Plan - 11/12/14 1550    Pt will benefit from skilled therapeutic intervention in order to improve on the following deficits Decreased range of motion;Postural dysfunction;Pain;Decreased activity tolerance;Difficulty walking   Rehab Potential Good   PT Frequency 2x / week   PT Duration 6 weeks  if improving at 4-6 visits   PT Treatment/Interventions ADLs/Self Care Home Management;Moist Heat;Electrical Stimulation;Manual techniques;Dry needling;Passive range of motion;Therapeutic exercise;Patient/family education   PT Next Visit  Plan Gentle manual and stretching, stim and heat, HEP? see what he can do   Consulted and Agree with Plan of  Care Patient          G-Codes - 11-16-14 1544    Functional Assessment Tool Used FOTO   Functional Limitation Self care   Self Care Current Status [phone]) At least 60 percent but less than 80 percent impaired, limited or restricted   Self Care Goal Status (B2841) At least 40 percent but less than 60 percent impaired, limited or restricted       Problem List Patient Active Problem List   Diagnosis Date Noted  . Pleural effusion, left 06/22/2012  . Hypertension 06/22/2012  . Alcoholism /alcohol abuse 06/22/2012  . Tobacco abuse 06/22/2012  . Lung cancer 06/22/2012  . Pleuritic chest pain 06/22/2012  . Arthritis 06/22/2012  . Acute kidney injury 06/22/2012  . GERD (gastroesophageal reflux disease) 06/22/2012  . HNP (herniated nucleus pulposus), lumbar 06/30/2011    Class: Diagnosis of    Darrel Hoover PT 2014/11/16, 4:00 PM  [place], [place], [address] Phone: [phone]   Fax:  [phone]    PHYSICAL THERAPY DISCHARGE SUMMARY  Visits from Start of Care: Eval only  Current functional level related to goals / functional outcomes: No changes  Remaining deficits: No changes. I spoke to [place] and he reports he received a note that his insurance would not pay for PT. He was not able to afford PT so asked for discharge   Education / Equipment: None Plan: Patient agrees to discharge.  Patient goals were not met. Patient is being discharged due to financial reasons.  ?????   Lillette Boxer Onedia Vargus PT   12/15/14  11:23 AM                                                                                                                PHYSICAL THERAPY DISCHARGE SUMMARY  Visits from Start of Care: 1  Current functional level related to goals / functional outcomes: Unknown as he did not return   Remaining deficits: Unknown   Education / Equipment: None  Plan:                                                     Patient goals were not met. Patient is being discharged due to not returning since the last visit.  ?????   Lillette Boxer Jakhai Fant PT          02/08/15     855AM

## 2014-11-24 ENCOUNTER — Ambulatory Visit: Payer: Medicare Other | Admitting: Physical Therapy

## 2014-12-01 ENCOUNTER — Ambulatory Visit: Payer: Medicare Other | Attending: Orthopaedic Surgery

## 2014-12-03 ENCOUNTER — Ambulatory Visit: Payer: Medicare Other | Admitting: Physical Therapy

## 2014-12-04 ENCOUNTER — Telehealth: Payer: Self-pay | Admitting: Physical Therapy

## 2014-12-04 NOTE — Telephone Encounter (Signed)
Spoke with pt regarding missed appointment yesterday. He states he was bitten by a tick and having a hard time getting around. I reminded him of his appointments over the next 3 weeks and asked that he call us if he cannot make it to these appointments.

## 2014-12-07 ENCOUNTER — Other Ambulatory Visit: Payer: Self-pay

## 2014-12-07 ENCOUNTER — Ambulatory Visit: Payer: Medicare Other | Admitting: Gastroenterology

## 2014-12-08 ENCOUNTER — Ambulatory Visit: Payer: Medicare Other | Admitting: Physical Therapy

## 2014-12-15 ENCOUNTER — Ambulatory Visit: Payer: Medicare Other

## 2014-12-17 ENCOUNTER — Ambulatory Visit: Payer: Medicare Other

## 2014-12-17 DIAGNOSIS — B1920 Unspecified viral hepatitis C without hepatic coma: Secondary | ICD-10-CM | POA: Insufficient documentation

## 2014-12-21 ENCOUNTER — Other Ambulatory Visit: Payer: Self-pay | Admitting: Orthopaedic Surgery

## 2014-12-21 DIAGNOSIS — M545 Low back pain: Secondary | ICD-10-CM

## 2014-12-22 ENCOUNTER — Ambulatory Visit: Payer: Self-pay | Admitting: Gastroenterology

## 2014-12-22 ENCOUNTER — Encounter: Payer: Medicare Other | Admitting: Physical Therapy

## 2014-12-24 ENCOUNTER — Ambulatory Visit: Payer: Medicare Other

## 2015-01-21 ENCOUNTER — Ambulatory Visit (INDEPENDENT_AMBULATORY_CARE_PROVIDER_SITE_OTHER): Payer: Medicare Other | Admitting: Gastroenterology

## 2015-01-21 ENCOUNTER — Encounter (INDEPENDENT_AMBULATORY_CARE_PROVIDER_SITE_OTHER): Payer: Self-pay

## 2015-01-21 VITALS — BP 131/84 | HR 86 | Temp 98.2°F | Ht 67.0 in | Wt 192.0 lb

## 2015-01-21 DIAGNOSIS — R748 Abnormal levels of other serum enzymes: Secondary | ICD-10-CM

## 2015-01-21 DIAGNOSIS — R894 Abnormal immunological findings in specimens from other organs, systems and tissues: Secondary | ICD-10-CM

## 2015-01-21 DIAGNOSIS — B1920 Unspecified viral hepatitis C without hepatic coma: Secondary | ICD-10-CM | POA: Diagnosis not present

## 2015-01-21 DIAGNOSIS — R768 Other specified abnormal immunological findings in serum: Secondary | ICD-10-CM

## 2015-01-21 NOTE — Progress Notes (Signed)
Gastroenterology Consultation  Referring Provider:     Lorelee Market, MD Primary Care Physician:  Lorelee Market, MD Primary Gastroenterologist:  Dr. Allen Norris     Reason for Consultation:     Hepatitis C        HPI:   Johnny Contreras is a 54 y.o. y/o male referred for consultation & management of hepatitis A by Dr. Brunetta Genera, Sabino Gasser, MD.  This patient comes in today with a blood test that showed a hepatitis C antibody being positive. The patient denies any risk factors for hepatitis C although he does have a tattoo that he had professionally done in [place] but he states it is a very reputable place. He denies any IV drug abuse in the past or drug use. He also denies any high risk sexual activity. The patient had liver enzymes that were normal and only a hepatitis C antibody positive. The patient denies any history of abnormal liver enzymes in the past or being told that he had hepatitis C in the past. She denies any fatigue or change in bowel habits.  Past Medical History  Diagnosis Date  . Chronic kidney disease     kidney stones ?  Marland Kitchen Arthritis   . GERD (gastroesophageal reflux disease)   . Hypertension   . Lung cancer October, 2013    Surgery and Chemo at [facility]  . Hepatitis C   . Pleural effusion, left 06/22/2012  . Cancer of lower lobe of lung 03/20/2012  . Alcoholism /alcohol abuse 06/22/2012    Daily beers and 'Hennessey' (cognac)   . Acute kidney injury 06/22/2012    Past Surgical History  Procedure Laterality Date  . Carpal tunnel release      bil  . Lumbar laminectomy  06/30/2011    Procedure: MICRODISCECTOMY LUMBAR LAMINECTOMY;  Surgeon: Marybelle Killings;  Location: [place] [facility];  Service: Orthopedics;  Laterality: Left;  Left L5-S1 Microdiscectomy  . Lung cancer surgery  October, 2013    [facility]    Prior to Admission medications   Medication Sig Start Date End Date Taking? Authorizing Provider  cyclobenzaprine (FLEXERIL) 10 MG tablet Take 10 mg by mouth at bedtime as  needed. 10/20/14  Yes Historical Provider, MD  LISINOPRIL PO Take by mouth.   Yes Historical Provider, MD  OMEPRAZOLE PO Take by mouth.   Yes Historical Provider, MD  oxyCODONE (OXY IR/ROXICODONE) 5 MG immediate release tablet Take 5 mg by mouth every 4 (four) hours as needed. 10/23/14  Yes Historical Provider, MD  TRAMADOL HCL PO Take by mouth.   Yes Historical Provider, MD  AMLODIPINE BESYLATE PO Take by mouth.    Historical Provider, MD  GABAPENTIN PO Take by mouth.    Historical Provider, MD    Family History  Problem Relation Age of Onset  . Emphysema Father      History  Substance Use Topics  . Smoking status: Current Some Day Smoker -- 1.50 packs/day  . Smokeless tobacco: Not on file  . Alcohol Use: 2.4 - 4.8 oz/week    1-2 Glasses of wine, 3-6 Cans of beer per week    Allergies as of 01/21/2015 - Review Complete 01/21/2015  Allergen Reaction Noted  . Amitriptyline Other (See Comments) 12/07/2014    Review of Systems:    All systems reviewed and negative except where noted in HPI.   Physical Exam:  BP 131/84 mmHg  Pulse 86  Temp(Src) 98.2 F (36.8 C) (Oral)  Ht '5\' 7"'$  (1.702 m)  Wt 192  lb (87.091 kg)  BMI 30.06 kg/m2 No LMP for male patient. Psych:  Alert and cooperative. Normal mood and affect. General:   Alert,  Well-developed, well-nourished, pleasant and cooperative in NAD Head:  Normocephalic and atraumatic. Eyes:  Sclera clear, no icterus.   Conjunctiva pink. Ears:  Normal auditory acuity. Nose:  No deformity, discharge, or lesions. Mouth:  No deformity or lesions,oropharynx pink & moist. Neck:  Supple; no masses or thyromegaly. Lungs:  Respirations even and unlabored.  Clear throughout to auscultation.   No wheezes, crackles, or rhonchi. No acute distress. Heart:  Regular rate and rhythm; no murmurs, clicks, rubs, or gallops. Abdomen:  Normal bowel sounds.  No bruits.  Soft, non-tender and non-distended without masses, hepatosplenomegaly or hernias noted.   No guarding or rebound tenderness.  Negative Carnett sign.   Rectal:  Deferred.  Msk:  Symmetrical without gross deformities.  Good, equal movement & strength bilaterally. Pulses:  Normal pulses noted. Extremities:  No clubbing or edema.  No cyanosis. Neurologic:  Alert and oriented x3;  grossly normal neurologically. Skin:  Intact without significant lesions or rashes.  No jaundice. Lymph Nodes:  No significant cervical adenopathy. Psych:  Alert and cooperative. Normal mood and affect.  Imaging Studies: No results found.  Assessment and Plan:   Johnny Contreras is a 54 y.o. y/o male who comes today with a abnormal hepatitis C antibody with normal liver enzymes. The patient denies any high risk activity. The patient will be set up for a hepatitis C genotype and viral load. This will tell us if the patient's antibodies possibly a false positive. The patient has been explained the plan and agrees with it.

## 2015-01-22 ENCOUNTER — Ambulatory Visit
Admission: RE | Admit: 2015-01-22 | Discharge: 2015-01-22 | Disposition: A | Discharge status: Home / Self Care | Payer: Medicare Other | Source: Ambulatory Visit | Attending: Orthopaedic Surgery | Admitting: Orthopaedic Surgery

## 2015-01-22 DIAGNOSIS — M545 Low back pain: Secondary | ICD-10-CM

## 2015-03-04 ENCOUNTER — Telehealth: Payer: Self-pay | Admitting: Gastroenterology

## 2015-03-04 NOTE — Telephone Encounter (Signed)
Patient is asking for a call about results

## 2015-03-05 ENCOUNTER — Other Ambulatory Visit: Payer: Self-pay | Admitting: Gastroenterology

## 2015-03-05 ENCOUNTER — Other Ambulatory Visit: Payer: Self-pay

## 2015-03-05 DIAGNOSIS — B1920 Unspecified viral hepatitis C without hepatic coma: Secondary | ICD-10-CM

## 2015-03-05 NOTE — Telephone Encounter (Signed)
Returned pt's call regarding his results. Confirmed his Hep C. Ordered RUQ [place] limited with tissue elastography. Pt aware this was being done. Advised to call me if he has not heard from scheduling by early next week. Will send for treatment when these return.

## 2015-03-18 ENCOUNTER — Ambulatory Visit: Payer: Medicare Other

## 2015-03-19 ENCOUNTER — Other Ambulatory Visit: Payer: Medicare Other

## 2015-03-19 ENCOUNTER — Ambulatory Visit
Admission: RE | Admit: 2015-03-19 | Discharge: 2015-03-19 | Disposition: A | Discharge status: Home / Self Care | Payer: Medicare Other | Source: Ambulatory Visit | Attending: Gastroenterology | Admitting: Gastroenterology

## 2015-03-19 DIAGNOSIS — B1920 Unspecified viral hepatitis C without hepatic coma: Secondary | ICD-10-CM | POA: Diagnosis not present

## 2015-03-19 DIAGNOSIS — K8020 Calculus of gallbladder without cholecystitis without obstruction: Secondary | ICD-10-CM | POA: Insufficient documentation

## 2015-03-24 ENCOUNTER — Telehealth: Payer: Self-pay

## 2015-03-24 NOTE — Telephone Encounter (Signed)
LVM for pt to return my call.

## 2015-03-24 NOTE — Telephone Encounter (Signed)
LVM for pt to return my call regarding results. Paperwork faxed to [facility] pharmacy. Waiting approval.

## 2015-03-24 NOTE — Telephone Encounter (Signed)
-----   Message from Lucilla Lame, MD sent at 03/22/2015 11:18 AM EDT ----- Let the patient know that his fibrosis score was calculated at 2-3 out of 4.

## 2015-03-24 NOTE — Telephone Encounter (Signed)
-----   Message from Lucilla Lame, MD sent at 03/22/2015 11:19 AM EDT ----- There was also stones in his gallbladder but does not look like it's causing any problems.

## 2015-03-26 ENCOUNTER — Telehealth: Payer: Self-pay

## 2015-03-26 NOTE — Telephone Encounter (Signed)
Pt notified of results. Pt doesn't have a medication plan with Medicare. Pt will come by and fill out the patient assistance forms on Monday.

## 2015-03-26 NOTE — Telephone Encounter (Signed)
Pt notified. Will come by Monday to complete patient assistance forms.

## 2015-03-26 NOTE — Telephone Encounter (Signed)
-----   Message from Lucilla Lame, MD sent at 03/22/2015 11:19 AM EDT ----- There was also stones in his gallbladder but does not look like it's causing any problems.

## 2015-04-12 ENCOUNTER — Telehealth: Payer: Self-pay

## 2015-04-12 NOTE — Telephone Encounter (Signed)
Called pt and informed him [employer] has requested more information from him such as tax return W2, Copy of drivers license etc. They need this in order to help get his Hep C medication. Pt stated he will bring those by.

## 2015-06-29 ENCOUNTER — Other Ambulatory Visit: Payer: Self-pay | Admitting: Orthopaedic Surgery

## 2015-06-29 DIAGNOSIS — R0989 Other specified symptoms and signs involving the circulatory and respiratory systems: Secondary | ICD-10-CM

## 2015-07-14 ENCOUNTER — Ambulatory Visit
Admission: RE | Admit: 2015-07-14 | Discharge: 2015-07-14 | Disposition: A | Discharge status: Home / Self Care | Payer: Medicare Other | Source: Ambulatory Visit | Attending: Orthopaedic Surgery | Admitting: Orthopaedic Surgery

## 2015-07-14 DIAGNOSIS — R0989 Other specified symptoms and signs involving the circulatory and respiratory systems: Secondary | ICD-10-CM

## 2016-05-08 ENCOUNTER — Telehealth (INDEPENDENT_AMBULATORY_CARE_PROVIDER_SITE_OTHER): Payer: Self-pay | Admitting: Orthopaedic Surgery

## 2016-05-08 DIAGNOSIS — M545 Low back pain: Secondary | ICD-10-CM

## 2016-05-08 MED ORDER — CYCLOBENZAPRINE HCL 10 MG PO TABS: 10.0000 mg | ORAL_TABLET | Freq: Every evening | ORAL | 0 refills | Status: DC | PRN

## 2016-05-08 NOTE — Telephone Encounter (Signed)
OK for # 20 tabs    1 po q HS prn . Let him know this is the last refill thanks

## 2016-05-08 NOTE — Telephone Encounter (Signed)
Sent to pharmacy 

## 2016-05-08 NOTE — Telephone Encounter (Signed)
Patient called needing Rx (cyclobnzaprine) refilled  The number to contact him is [phone]

## 2016-05-08 NOTE — Telephone Encounter (Signed)
Please advise 

## 2016-06-01 ENCOUNTER — Telehealth (INDEPENDENT_AMBULATORY_CARE_PROVIDER_SITE_OTHER): Payer: Self-pay | Admitting: Orthopaedic Surgery

## 2016-06-01 DIAGNOSIS — M545 Low back pain: Secondary | ICD-10-CM

## 2016-06-01 MED ORDER — CYCLOBENZAPRINE HCL 10 MG PO TABS: 10.0000 mg | ORAL_TABLET | Freq: Every evening | ORAL | 0 refills | Status: DC | PRN

## 2016-06-01 NOTE — Telephone Encounter (Signed)
Drug list had no entries. Per Dr. Lorin Mercy, ok to refill. Script entered and sent to pharmacy. I called patient and advised.

## 2016-06-01 NOTE — Telephone Encounter (Signed)
Ok for refill? 

## 2016-06-01 NOTE — Telephone Encounter (Signed)
Please run drug list thanks and send back

## 2016-06-01 NOTE — Telephone Encounter (Signed)
Pt requesting rx refill for flexeril [facility] [place], she stated [facility] said they faxed over something to [place] but we haven't responded. Pt number is  [phone]

## 2016-06-16 ENCOUNTER — Encounter (INDEPENDENT_AMBULATORY_CARE_PROVIDER_SITE_OTHER): Payer: Self-pay

## 2016-06-16 ENCOUNTER — Ambulatory Visit (INDEPENDENT_AMBULATORY_CARE_PROVIDER_SITE_OTHER): Payer: Medicare Other | Admitting: Orthopaedic Surgery

## 2016-06-16 ENCOUNTER — Encounter (INDEPENDENT_AMBULATORY_CARE_PROVIDER_SITE_OTHER): Payer: Self-pay | Admitting: Orthopaedic Surgery

## 2016-06-16 VITALS — BP 158/104 | HR 80

## 2016-06-16 DIAGNOSIS — M5442 Lumbago with sciatica, left side: Secondary | ICD-10-CM

## 2016-06-16 NOTE — Progress Notes (Signed)
Office Visit Note   Patient: Johnny Contreras           Date of Birth: 04/26/1961           MRN: [id number] Visit Date: 06/16/2016              Requested by: Lorelee Market, MD [place], [place] [address] PCP: Lorelee Market, MD   Assessment & Plan: Visit Diagnoses:  1. Acute back pain with sciatica, left     Plan: Previous left L5-S1 microdiscectomy left. Patient has ongoing pain has failed to improve with epidural steroid injection he's been through exercise program. He's been on some narcotic medication the past no new prescription was given today. Will obtain an MRI with and without contrast for evaluating him. Last scan showed some evidence of a disc protrusion right left at 51 with facet arthropathy and some lateral recess narrowing. Follow-up after MRI.  Follow-Up Instructions: No Follow-up on file.   Orders:  No orders of the defined types were placed in this encounter.  No orders of the defined types were placed in this encounter.     Procedures: No procedures performed   Clinical Data: No additional findings.   Subjective: Chief Complaint  Patient presents with  . Lower Back - Pain    HPI  Review of Systems  Constitutional: Negative for chills and diaphoresis.  HENT: Negative for ear discharge, ear pain and nosebleeds.   Eyes: Negative for discharge and visual disturbance.  Respiratory: Negative for cough, choking and shortness of breath.   Cardiovascular: Negative for chest pain and palpitations.  Gastrointestinal: Negative for abdominal distention and abdominal pain.       There is history of hepatitis treated with harvoni  Endocrine: Negative for cold intolerance and heat intolerance.  Genitourinary: Negative for flank pain and hematuria.  Musculoskeletal: Positive for back pain.  Skin: Negative for rash and wound.  Neurological: Negative for seizures and speech difficulty.  Hematological: Negative for adenopathy. Does not  bruise/bleed easily.  Psychiatric/Behavioral: Negative for agitation and suicidal ideas.     Objective: Vital Signs: BP (!) 158/104   Pulse 80   Physical Exam  Constitutional: He is oriented to person, place, and time. He appears well-developed and well-nourished.  HENT:  Head: Normocephalic and atraumatic.  Eyes: EOM are normal. Pupils are equal, round, and reactive to light.  Neck: No tracheal deviation present. No thyromegaly present.  Cardiovascular: Normal rate.   Pulmonary/Chest: Effort normal. He has no wheezes.  Abdominal: Soft. Bowel sounds are normal.  Musculoskeletal:  Healed the lumbar microdiscectomy incision. Tenderness in sciatic notch pain with straight leg raising is difficulty getting up right. He's been on the oxycodone and tramadol in the past. Ankle jerk are intact. No rash on exposed skin. Anterior tib HL is strong.  Neurological: He is alert and oriented to person, place, and time.  Skin: Skin is warm and dry. Capillary refill takes less than 2 seconds.  Psychiatric: He has a normal mood and affect. His behavior is normal. Judgment and thought content normal.    Ortho Exam  Specialty Comments:  No specialty comments available.  Imaging: No results found.   PMFS History: Patient Active Problem List   Diagnosis Date Noted  . Acute back pain with sciatica, left 06/16/2016  . Hepatitis C 12/17/2014  . Pleural effusion, left 06/22/2012  . Hypertension 06/22/2012  . Alcoholism /alcohol abuse ([place]) 06/22/2012  . Tobacco abuse 06/22/2012  . Lung cancer ([place]) 06/22/2012  .  Pleuritic chest pain 06/22/2012  . Arthritis 06/22/2012  . Acute kidney injury ([place]) 06/22/2012  . GERD (gastroesophageal reflux disease) 06/22/2012  . Cancer of lower lobe of lung ([place]) 03/20/2012  . Back ache 02/28/2012  . CAFL (chronic airflow limitation) ([place]) 01/25/2012  . HNP (herniated nucleus pulposus), lumbar 06/30/2011    Class: Diagnosis of   Past Medical History:    Diagnosis Date  . Acute kidney injury ([place]) 06/22/2012  . Alcoholism /alcohol abuse ([place]) 06/22/2012   Daily beers and 'Hennessey' (cognac)   . Arthritis   . Cancer of lower lobe of lung ([place]) 03/20/2012  . Chronic kidney disease    kidney stones ?  Marland Kitchen GERD (gastroesophageal reflux disease)   . Hepatitis C   . Hypertension   . Lung cancer [facility]) October, 2013   [facility] at [facility]  . Pleural effusion, left 06/22/2012    Family History  Problem Relation Age of Onset  . Emphysema Father     Past Surgical History:  Procedure Laterality Date  . CARPAL TUNNEL RELEASE     bil  . LUMBAR LAMINECTOMY  06/30/2011   Procedure: MICRODISCECTOMY LUMBAR LAMINECTOMY;  Surgeon: Marybelle Killings;  Location: [place];  Service: Orthopedics;  Laterality: Left;  Left L5-S1 Microdiscectomy  . LUNG CANCER SURGERY  October, 2013   [facility]   Social History   Occupational History  . Not on file.   Social History Main Topics  . Smoking status: Current Some Day Smoker    Packs/day: 1.50  . Smokeless tobacco: Not on file  . Alcohol use 2.4 - 4.8 oz/week    1 - 2 Glasses of wine, 3 - 6 Cans of beer per week  . Drug use: No  . Sexual activity: Not on file

## 2016-06-16 NOTE — Addendum Note (Signed)
Addended by: Meyer Cory on: 06/16/2016 04:44 PM   Modules accepted: Orders

## 2016-06-27 ENCOUNTER — Other Ambulatory Visit (INDEPENDENT_AMBULATORY_CARE_PROVIDER_SITE_OTHER): Payer: Self-pay | Admitting: Orthopaedic Surgery

## 2016-06-27 DIAGNOSIS — M545 Low back pain: Secondary | ICD-10-CM

## 2016-06-28 NOTE — Telephone Encounter (Signed)
Ok to refill 

## 2016-07-03 ENCOUNTER — Ambulatory Visit
Admission: RE | Admit: 2016-07-03 | Discharge: 2016-07-03 | Disposition: A | Discharge status: Home / Self Care | Payer: Medicare Other | Source: Ambulatory Visit | Attending: Orthopaedic Surgery | Admitting: Orthopaedic Surgery

## 2016-07-03 DIAGNOSIS — M5442 Lumbago with sciatica, left side: Secondary | ICD-10-CM

## 2016-07-03 MED ORDER — GADOBENATE DIMEGLUMINE 529 MG/ML IV SOLN
17.0000 mL | Freq: Once | INTRAVENOUS | Status: AC | PRN
  Administered 2016-07-03: 17 mL via INTRAVENOUS

## 2016-07-11 ENCOUNTER — Ambulatory Visit (INDEPENDENT_AMBULATORY_CARE_PROVIDER_SITE_OTHER): Payer: Medicare Other | Admitting: Orthopaedic Surgery

## 2016-07-11 ENCOUNTER — Encounter (INDEPENDENT_AMBULATORY_CARE_PROVIDER_SITE_OTHER): Payer: Self-pay | Admitting: Orthopaedic Surgery

## 2016-07-11 VITALS — BP 193/112 | HR 88 | Ht 67.0 in | Wt 180.0 lb

## 2016-07-11 DIAGNOSIS — M48062 Spinal stenosis, lumbar region with neurogenic claudication: Secondary | ICD-10-CM | POA: Diagnosis not present

## 2016-07-11 DIAGNOSIS — I739 Peripheral vascular disease, unspecified: Secondary | ICD-10-CM

## 2016-07-11 NOTE — Progress Notes (Signed)
Office Visit Note   Patient: Johnny Contreras           Date of Birth: 11-Jan-1961           MRN: [id number] Visit Date: 07/11/2016              Requested by: Lorelee Market, MD [place], [place] [address] PCP: Lorelee Market, MD   Assessment & Plan: Visit Diagnoses:  1. Claudication ([place])   2. 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 Previous surgery shows no evidence of distant recurrence at the L5-S1 level from his surgery in 2013 on the left side. His long-term smoker has decreased pulses and will obtain arterial Dopplers to make sure there is not some degree of arterial claudication and the symptoms he is having. Office follow-up after arterial Dopplers.  Follow-Up Instructions: Return after dopplers, for after dopplers.   Orders:  Orders Placed This Encounter  Procedures  . Ambulatory referral to Vascular Surgery   No orders of the defined types were placed in this encounter.     Procedures: No procedures performed   Clinical Data: No additional findings.   Subjective: Chief Complaint  Patient presents with  . Lower Back - Pain, Follow-up    Patient returns for MRI review lumbar spine. He states that his back feels like a toothache. He denies taking any meds for pain. He does take cyclobenzaprine at night which he states allows him to get about 4 hours of sleep.     Review of Systems  Constitutional: Negative for chills and diaphoresis.  HENT: Negative for ear discharge, ear pain and nosebleeds.   Eyes: Negative for discharge and visual disturbance.  Respiratory: Negative for choking, chest tightness, shortness of breath and stridor.        Patient is a long-term heavy smoker.  Cardiovascular: Negative for chest pain and palpitations.  Gastrointestinal: Negative for abdominal distention and abdominal pain.  Endocrine: Negative for cold  intolerance and heat intolerance.  Genitourinary: Negative for flank pain and hematuria.  Musculoskeletal:       Previous lumbar surgery L5-S1. He has pain with the standing for more than 45 minutes pain with ambulation can go about a block. He does somewhat better if he leans over a cart. He notes his feet get cold his temperature drops.  Skin: Negative for rash and wound.  Neurological: Negative for seizures and speech difficulty.  Hematological: Negative for adenopathy. Does not bruise/bleed easily.  Psychiatric/Behavioral: Negative for agitation and suicidal ideas.       Positive history of significant alcohol consumption.     Objective: Vital Signs: BP (!) 193/112   Pulse 88   Ht '5\' 7"'$  (1.702 m)   Wt 180 lb (81.6 kg)   BMI 28.19 kg/m   Physical Exam  Constitutional: He is oriented to person, place, and time. He appears well-developed and well-nourished.  HENT:  Head: Normocephalic and atraumatic.  Eyes: EOM are normal. Pupils are equal, round, and reactive to light.  Neck: No tracheal deviation present. No thyromegaly present.  Cardiovascular: Normal rate.   Pulmonary/Chest: Effort normal. He has no wheezes.  Abdominal: Soft. Bowel sounds are normal.  Musculoskeletal:  Patient has normal hip range of motion negative straight leg raising 90 some sciatic notch tenderness tenderness over the lumbar spine lumbar incision is well-healed at L5-S1. He has trace pedal pulses right and left dorsalis pedis posterior  tibial. No venous stasis changes. Sensation is foot is intact. He has more symptoms radiate from his back down his left leg down to his lateral left ankle. The ankle jerk are intact.   Neurological: He is alert and oriented to person, place, and time.  Skin: Skin is warm and dry. Capillary refill takes less than 2 seconds.  Psychiatric: He has a normal mood and affect. His behavior is normal. Judgment and thought content normal.    Ortho Exam  Specialty Comments:  No  specialty comments available.  Imaging: No results found.   PMFS History: Patient Active Problem List   Diagnosis Date Noted  . Acute back pain with sciatica, left 06/16/2016  . Hepatitis C 12/17/2014  . Pleural effusion, left 06/22/2012  . Hypertension 06/22/2012  . Alcoholism /alcohol abuse ([place]) 06/22/2012  . Tobacco abuse 06/22/2012  . Lung cancer ([place]) 06/22/2012  . Pleuritic chest pain 06/22/2012  . Arthritis 06/22/2012  . Acute kidney injury ([place]) 06/22/2012  . GERD (gastroesophageal reflux disease) 06/22/2012  . Cancer of lower lobe of lung ([place]) 03/20/2012  . Back ache 02/28/2012  . CAFL (chronic airflow limitation) ([place]) 01/25/2012  . HNP (herniated nucleus pulposus), lumbar 06/30/2011    Class: Diagnosis of   Past Medical History:  Diagnosis Date  . Acute kidney injury ([place]) 06/22/2012  . Alcoholism /alcohol abuse ([place]) 06/22/2012   Daily beers and 'Hennessey' (cognac)   . Arthritis   . Cancer of lower lobe of lung ([place]) 03/20/2012  . Chronic kidney disease    kidney stones ?  Marland Kitchen GERD (gastroesophageal reflux disease)   . Hepatitis C   . Hypertension   . Lung cancer [facility]) October, 2013   [facility] at [facility]  . Pleural effusion, left 06/22/2012    Family History  Problem Relation Age of Onset  . Emphysema Father     Past Surgical History:  Procedure Laterality Date  . CARPAL TUNNEL RELEASE     bil  . LUMBAR LAMINECTOMY  06/30/2011   Procedure: MICRODISCECTOMY LUMBAR LAMINECTOMY;  Surgeon: Marybelle Killings;  Location: [place];  Service: Orthopedics;  Laterality: Left;  Left L5-S1 Microdiscectomy  . LUNG CANCER SURGERY  October, 2013   [facility]   Social History   Occupational History  . Not on file.   Social History Main Topics  . Smoking status: Current Some Day Smoker    Packs/day: 1.50  . Smokeless tobacco: Not on file  . Alcohol use 2.4 - 4.8 oz/week    1 - 2 Glasses of wine, 3 - 6 Cans of beer per week  . Drug use: No  . Sexual activity: Not on  file

## 2016-07-17 ENCOUNTER — Telehealth (INDEPENDENT_AMBULATORY_CARE_PROVIDER_SITE_OTHER): Payer: Self-pay | Admitting: Orthopaedic Surgery

## 2016-07-17 NOTE — Telephone Encounter (Signed)
Pt requested info for vein center we referred him to so he can call them.  [phone]

## 2016-07-17 NOTE — Telephone Encounter (Signed)
I called patient and gave him information to [facility].

## 2016-07-19 ENCOUNTER — Other Ambulatory Visit (INDEPENDENT_AMBULATORY_CARE_PROVIDER_SITE_OTHER): Payer: Self-pay | Admitting: *Deleted

## 2016-07-19 ENCOUNTER — Telehealth (INDEPENDENT_AMBULATORY_CARE_PROVIDER_SITE_OTHER): Payer: Self-pay | Admitting: *Deleted

## 2016-07-19 DIAGNOSIS — I739 Peripheral vascular disease, unspecified: Secondary | ICD-10-CM

## 2016-07-19 NOTE — Telephone Encounter (Signed)
Received call this am from [facility] at [facility] vas lab stating Dr. Lorin Mercy put in wrong order for Vas [place] and stated it needed to be VAS US arterial Doppler so she can get pt scheduled, order was put in as VAS [id number]

## 2016-07-29 ENCOUNTER — Other Ambulatory Visit (INDEPENDENT_AMBULATORY_CARE_PROVIDER_SITE_OTHER): Payer: Self-pay | Admitting: Orthopaedic Surgery

## 2016-07-29 DIAGNOSIS — M545 Low back pain: Secondary | ICD-10-CM

## 2016-07-31 NOTE — Telephone Encounter (Signed)
Ok for refill? 

## 2016-08-07 ENCOUNTER — Encounter (HOSPITAL_COMMUNITY): Payer: Medicare Other

## 2016-08-28 ENCOUNTER — Other Ambulatory Visit (INDEPENDENT_AMBULATORY_CARE_PROVIDER_SITE_OTHER): Payer: Self-pay | Admitting: Orthopaedic Surgery

## 2016-08-28 DIAGNOSIS — M545 Low back pain: Secondary | ICD-10-CM

## 2016-08-28 NOTE — Telephone Encounter (Signed)
Refilled x 2

## 2016-08-28 NOTE — Telephone Encounter (Signed)
Please advise 

## 2016-08-28 NOTE — Telephone Encounter (Signed)
OK refill times 2     thanks

## 2016-08-31 ENCOUNTER — Ambulatory Visit (HOSPITAL_COMMUNITY)
Admission: RE | Admit: 2016-08-31 | Discharge: 2016-08-31 | Disposition: A | Discharge status: Home / Self Care | Payer: Medicare Other | Source: Ambulatory Visit | Attending: Orthopaedic Surgery | Admitting: Orthopaedic Surgery

## 2016-08-31 DIAGNOSIS — I739 Peripheral vascular disease, unspecified: Secondary | ICD-10-CM | POA: Diagnosis not present

## 2016-09-26 ENCOUNTER — Encounter (INDEPENDENT_AMBULATORY_CARE_PROVIDER_SITE_OTHER): Payer: Self-pay | Admitting: Orthopaedic Surgery

## 2016-09-26 ENCOUNTER — Ambulatory Visit (INDEPENDENT_AMBULATORY_CARE_PROVIDER_SITE_OTHER): Payer: Medicare Other | Admitting: Orthopaedic Surgery

## 2016-09-26 VITALS — BP 187/113 | HR 99 | Ht 67.0 in | Wt 180.0 lb

## 2016-09-26 DIAGNOSIS — M48062 Spinal stenosis, lumbar region with neurogenic claudication: Secondary | ICD-10-CM | POA: Diagnosis not present

## 2016-09-26 NOTE — Progress Notes (Signed)
Office Visit Note   Patient: Johnny Contreras           Date of Birth: [birth year]           MRN: [id number] Visit Date: 09/26/2016              Requested by: Lorelee Market, MD [place], [place] [id number] PCP: Lorelee Market, MD   Assessment & Plan: Visit Diagnoses:  1. Spinal stenosis of lumbar region with neurogenic claudication           Congenital stenosis with short pedicles.  Plan: he will continue to work on his walk He has an appointment in June an after his cancer surgery that was done at [facility].patient has some shortness of breath with walking. If surgery was considered  For his lumbar spine he would need pr performed.  Follow-Up Instructions: Return in about 3 months (around 12/26/2016).   Orders:  No orders of the defined types were placed in this encounter.  No orders of the defined types were placed in this encounter.     Procedures: No procedures performed   Clinical Data: No additional findings.   Subjective: Chief Complaint  Patient presents with  . Lower Back - Pain  . Left Leg - Pain    Patient returns for follow up low back and left leg pain. He has had Arterial Dopplers Lower Extremity and is here to review those as well. He states that he stopped the tylenol because it does nothing for him. He does take cyclobenzaprine at night which gives him about 4 hours of sleep.     Review of Systems Left lung cancer resection done at [facility]. Thoracoscopy with decortication of the lung December 2013. Constitutional: Negative for chills and diaphoresis.  HENT: Negative for ear discharge, ear pain and nosebleeds.   Eyes: Negative for discharge and visual disturbance.  Respiratory: Negative for choking, chest tightness, shortness of breath and stridor.        Patient is a long-term heavy smoker.  Cardiovascular: Negative for chest pain and palpitations.  Gastrointestinal: Negative for abdominal distention and abdominal pain.  Endocrine: Negative  for cold intolerance and heat intolerance.  Genitourinary: Negative for flank pain and hematuria.  Musculoskeletal:       Previous lumbar surgery L5-S1. He has pain with the standing for more than 45 minutes pain with ambulation can go about a block. He does somewhat better if he leans over a cart. He notes his feet get cold his temperature drops.  Skin: Negative for rash and wound.  Neurological: Negative for seizures and speech difficulty.  Hematological: Negative for adenopathy. Does not bruise/bleed easily.  Psychiatric/Behavioral: Negative for agitation and suicidal ideas.       Positive history of significant alcohol consumption.  Left L5-S1 microdiscectomy January .  Objective: Vital Signs: BP (!) 187/113   Pulse 99   Ht '5\' 7"'$  (1.702 m)   Wt 180 lb (81.6 kg)   BMI 28.19 kg/m   Physical Exam  Constitutional: He is oriented to person, place, and time. He appears well-developed and well-nourished.  HENT:  Head: Normocephalic and atraumatic.  Eyes: EOM are normal. Pupils are equal, round, and reactive to light.  Neck: No tracheal deviation present. No thyromegaly present.  Cardiovascular: Normal rate.   Pulmonary/Chest: Effort normal. He has no wheezes.  Patient has cough has some shortness of breath with ambulating No audible wheezing. Left-sided chest incision from previous surgery.  Abdominal: Soft. Bowel sounds are normal.  Musculoskeletal:  Well healed lumbar incision L5-S1. Reflexes are intact. Normal hip range of motion full kne No isolated  Weakness.anterior tib and gastrocsoleus is intact. EHL is normal and symmetrical.  Neurological: He is alert and oriented to person, place, and time.  Skin: Skin is warm and dry. Capillary refill takes less than 2 seconds.  Psychiatric: He has a normal mood and affect. His behavior is normal. Judgment and thought content normal.    Ortho Exam  Specialty Comments:  ABIs of the lower extremity were  Reviewed and they are  normal.  Imaging: No results found.   PMFS History: Patient Active Problem List   Diagnosis Date Noted  . Acute back pain with sciatica, left 06/16/2016  . Hepatitis C 12/17/2014  . Pleural effusion, left 06/22/2012  . Hypertension 06/22/2012  . Alcoholism /alcohol abuse ([place]) 06/22/2012  . Tobacco abuse 06/22/2012  . Lung cancer ([place]) 06/22/2012  . Pleuritic chest pain 06/22/2012  . Arthritis 06/22/2012  . Acute kidney injury ([place]) 06/22/2012  . GERD (gastroesophageal reflux disease) 06/22/2012  . Cancer of lower lobe of lung ([place]) 03/20/2012  . Back ache 02/28/2012  . CAFL (chronic airflow limitation) ([place]) 01/25/2012  . HNP (herniated nucleus pulposus), lumbar 06/30/2011    Class: Diagnosis of   Past Medical History:  Diagnosis Date  . Acute kidney injury ([place]) 06/22/2012  . Alcoholism /alcohol abuse ([place]) 06/22/2012   Daily beers and 'Hennessey' (cognac)   . Arthritis   . Cancer of lower lobe of lung ([place]) 03/20/2012  . Chronic kidney disease    kidney stones ?  Marland Kitchen GERD (gastroesophageal reflux disease)   . Hepatitis C   . Hypertension   . Lung cancer [facility]) October, 2013   Surgery and Chem[facility] at [facility]  . Pleural effusion, left 06/22/2012    Family History  Problem Relation Age of Onset  . Emphysema Father     Past Surgical History:  Procedure Laterality Date  . CARPAL TUNNEL RELEASE     bil  . LUMBAR LAMINECTOMY  06/30/2011   Procedure: MICRODISCECTOMY LUMBAR LAMINECTOMY;  Surgeon: Marybelle Killings;  Location: [place];  Service: Orthopedics;  Laterality: Left;  Left L5-S1 Microdiscectomy  . LUNG CANCER SURGERY  October, 2013   [facility]   Social History   Occupational History  . Not on file.   Social History Main Topics  . Smoking status: Current Some Day Smoker    Packs/day: 1.50  . Smokeless tobacco: Never Used  . Alcohol use 2.4 - 4.8 oz/week    1 - 2 Glasses of wine, 3 - 6 Cans of beer per week  . Drug use: No  . Sexual activity: Not on file

## 2016-09-29 ENCOUNTER — Telehealth (INDEPENDENT_AMBULATORY_CARE_PROVIDER_SITE_OTHER): Payer: Self-pay | Admitting: Orthopaedic Surgery

## 2016-09-29 NOTE — Telephone Encounter (Signed)
Ice , rest, NSAID OTC if he can take it.   ROV in 2 wks if stil having problems.

## 2016-09-29 NOTE — Telephone Encounter (Signed)
Patient called advised he was walking around his yard yesterday and his right knee gave way and patient said he fell. Patient said he started have muscle spasms. Patient said his right knee is swollen. The number to contact patient is [phone]

## 2016-09-29 NOTE — Telephone Encounter (Signed)
Please advise 

## 2016-09-29 NOTE — Telephone Encounter (Signed)
fyi..I called patient and he states that he cannot walk at all. His leg is weak and if he puts too much weight on his leg, he just falls. He has tried ice and OTC meds with no relief. He states that he is going to have to go somewhere tomorrow and be seen because he cannot take the pain.

## 2016-10-03 ENCOUNTER — Other Ambulatory Visit (INDEPENDENT_AMBULATORY_CARE_PROVIDER_SITE_OTHER): Payer: Self-pay | Admitting: Radiology

## 2016-10-03 MED ORDER — PREDNISONE 10 MG (21) PO TBPK: ORAL_TABLET | Freq: Every day | ORAL | 0 refills | Status: DC

## 2016-10-03 NOTE — Telephone Encounter (Signed)
rx sent to pharm

## 2016-10-03 NOTE — Telephone Encounter (Signed)
Send  Prednisone '10mg'$  pack to [facility]. thanks

## 2016-10-11 ENCOUNTER — Telehealth (INDEPENDENT_AMBULATORY_CARE_PROVIDER_SITE_OTHER): Payer: Self-pay | Admitting: *Deleted

## 2016-10-11 DIAGNOSIS — M48062 Spinal stenosis, lumbar region with neurogenic claudication: Secondary | ICD-10-CM

## 2016-10-11 NOTE — Telephone Encounter (Signed)
Order entered for [facility]. I called patient and advised. He requested as soon as possible. I did explain that we would have to check to see if he needs authorization from his insurance and that he would receive a call to schedule injection.

## 2016-10-11 NOTE — Telephone Encounter (Signed)
Patient called in this morning in regards to his severe pain in his left leg. He has been seen in the past for his right leg but now he having in his other leg all the way from his buttocks down to the bottom of his leg. He would like to get some relief if at all possible. Thank you his CB # [phone].

## 2016-10-11 NOTE — Telephone Encounter (Signed)
Please advise 

## 2016-10-11 NOTE — Telephone Encounter (Signed)
Set up for [facility]  With [facility]. thanks

## 2016-10-13 ENCOUNTER — Telehealth (INDEPENDENT_AMBULATORY_CARE_PROVIDER_SITE_OTHER): Payer: Self-pay

## 2016-10-13 MED ORDER — TRAMADOL HCL 50 MG PO TABS: 50.0000 mg | ORAL_TABLET | Freq: Two times a day (BID) | ORAL | 0 refills | Status: DC

## 2016-10-13 NOTE — Addendum Note (Signed)
Addended by: Raymondo Band on: 10/13/2016 12:03 PM   Modules accepted: Orders

## 2016-10-13 NOTE — Telephone Encounter (Signed)
Prescription faxed to [facility] and patient notified. He wants to be called if we have any cancellations.

## 2016-10-13 NOTE — Telephone Encounter (Signed)
Patient would like to have a Rx for pain. Stated he has an appointment for an injection on 10/23/2016 with Dr. Ernestina Patches.  Please Advise.  CB # [phone]

## 2016-10-13 NOTE — Telephone Encounter (Signed)
Please advise. Patient sees Dr. Lorin Mercy who is out of the office today.

## 2016-10-13 NOTE — Telephone Encounter (Signed)
Printed tramadol, if anything else needed will need to send to Dr. Lorin Mercy.

## 2016-10-23 ENCOUNTER — Ambulatory Visit (INDEPENDENT_AMBULATORY_CARE_PROVIDER_SITE_OTHER): Payer: Medicare Other | Admitting: Physical Medicine and Rehabilitation

## 2016-10-23 ENCOUNTER — Ambulatory Visit (INDEPENDENT_AMBULATORY_CARE_PROVIDER_SITE_OTHER): Payer: Medicare Other

## 2016-10-23 ENCOUNTER — Encounter (INDEPENDENT_AMBULATORY_CARE_PROVIDER_SITE_OTHER): Payer: Self-pay | Admitting: Physical Medicine and Rehabilitation

## 2016-10-23 VITALS — BP 156/84 | HR 109 | Temp 98.2°F

## 2016-10-23 DIAGNOSIS — M5416 Radiculopathy, lumbar region: Secondary | ICD-10-CM

## 2016-10-23 NOTE — Patient Instructions (Signed)

## 2016-10-23 NOTE — Progress Notes (Signed)
Johnny Contreras - 56 y.o. male MRN [id number]  Date of birth: 08-04-1960  Office Visit Note: Visit Date: 10/23/2016 PCP: Lorelee Market, MD Referred by: Lorelee Market, MD  Subjective: Chief Complaint  Patient presents with  . Lower Back - Pain   HPI: [name] is a 56 year old gentleman followed by Dr. Lorin Mercy. He's had prior lumbar surgery. He is having bilateral low back pain with referral pattern of the hip and legs and more of an L5 distribution with cramping and locking up. His symptoms really do not go past the knee. Is worse with walking and standing. He does get relief with sitting. He is getting awakened at night because of the cramping leg pain. He gets a little bit of numbness on the right leg more than left. No focal weakness. She is using tramadol without any relief.    ROS Otherwise per HPI.  Assessment & Plan: Visit Diagnoses:  1. Lumbar radiculopathy     Plan: Findings:  Bilateral L5 transforaminal epidural steroid injection. The patient had quite a bit of radicular-type cramping pain post procedure but this didn't lessened significantly before he was discharged.    Meds & Orders: No orders of the defined types were placed in this encounter.   Orders Placed This Encounter  Procedures  . XR C-ARM NO REPORT  . Epidural Steroid injection    Follow-up: Return for Dr. Lorin Mercy.   Procedures: No procedures performed  Lumbosacral Transforaminal Epidural Steroid Injection - Infraneural Approach with Fluoroscopic Guidance  Patient: Johnny Contreras      Date of Birth: Feb 22, 1961 MRN: [id number] PCP: Lorelee Market, MD      Visit Date: 10/23/2016   Universal Protocol:    Date/Time: 04/30/186:08 AM  Consent Given By: the patient  Position: PRONE   Additional Comments: Vital signs were monitored before and after the procedure. Patient was prepped and draped in the usual sterile fashion. The correct patient, procedure, and site was  verified.   Injection Procedure Details:  Procedure Site One Meds Administered: No orders of the defined types were placed in this encounter.     Laterality: Bilateral  Location/Site:  L5-S1  Needle size: 22 G  Needle type: Spinal  Needle Placement: Transforaminal  Findings:  -Contrast Used: 1 mL iohexol 180 mg iodine/mL   -Comments: Excellent flow of contrast along the nerve and into the epidural space.  Procedure Details: After squaring off the end-plates of the desired vertebral level to get a true AP view, the C-arm was obliqued to the painful side so that the superior articulating process is positioned about 1/3 the length of the inferior endplate.  The needle was aimed toward the junction of the superior articular process and the transverse process of the inferior vertebrae. The needle's initial entry is in the lower third of the foramen through Kambin's triangle. The soft tissues overlying this target were infiltrated with 2-3 ml. of 1% Lidocaine without Epinephrine.  The spinal needle was then inserted and advanced toward the target using a "trajectory" view along the fluoroscope beam.  Under AP and lateral visualization, the needle was advanced so it did not puncture dura and did not traverse medially beyond the 6 o'clock position of the pedicle. Bi-planar projections were used to confirm position. Aspiration was confirmed to be negative for CSF and/or blood. A 1-2 ml. volume of Isovue-250 was injected and flow of contrast was noted at each level. Radiographs were obtained for documentation purposes.   After attaining the desired flow of contrast documented  above, a 0.5 to 1.0 ml test dose of 0.25% Marcaine was injected into each respective transforaminal space.  The patient was observed for 90 seconds post injection.  After no sensory deficits were reported, and normal lower extremity motor function was noted,   the above injectate was administered so that equal amounts of the  injectate were placed at each foramen (level) into the transforaminal epidural space.   Additional Comments:  The patient tolerated the procedure well Dressing: Band-Aid    Post-procedure details: Patient was observed during the procedure. Post-procedure instructions were reviewed.  Patient left the clinic in stable condition.    Clinical History: No specialty comments available.  He reports that he has been smoking.  He has been smoking about 1.50 packs per day. He has never used smokeless tobacco. No results for input(s): HGBA1C, LABURIC in the last 8760 hours.  Objective:  VS:  HT:    WT:   BMI:     BP:(!) 156/84  HR:(!) 109bpm  TEMP:98.2 F (36.8 C)(Oral)  RESP:96 % Physical Exam  Musculoskeletal:  Patient ambulates with a forward flexed spine and has tight hamstrings bilaterally but with good distal strength.    Ortho Exam Imaging: Xr C-arm No Report  Result Date: 10/23/2016 Please see Notes or Procedures tab for imaging impression.   Past Medical/Family/Surgical/Social History: Medications & Allergies reviewed per EMR Patient Active Problem List   Diagnosis Date Noted  . Acute back pain with sciatica, left 06/16/2016  . Hepatitis C 12/17/2014  . Pleural effusion, left 06/22/2012  . Hypertension 06/22/2012  . Alcoholism /alcohol abuse ([place]) 06/22/2012  . Tobacco abuse 06/22/2012  . Lung cancer ([place]) 06/22/2012  . Pleuritic chest pain 06/22/2012  . Arthritis 06/22/2012  . Acute kidney injury ([place]) 06/22/2012  . GERD (gastroesophageal reflux disease) 06/22/2012  . Cancer of lower lobe of lung ([place]) 03/20/2012  . Back ache 02/28/2012  . CAFL (chronic airflow limitation) ([place]) 01/25/2012  . HNP (herniated nucleus pulposus), lumbar 06/30/2011    Class: Diagnosis of   Past Medical History:  Diagnosis Date  . Acute kidney injury ([place]) 06/22/2012  . Alcoholism /alcohol abuse ([place]) 06/22/2012   Daily beers and 'Hennessey' (cognac)   . Arthritis   . Cancer  of lower lobe of lung ([place]) 03/20/2012  . Chronic kidney disease    kidney stones ?  Marland Kitchen GERD (gastroesophageal reflux disease)   . Hepatitis C   . Hypertension   . Lung cancer [facility]) October, 2013   Surgery and Chemo at [facility]  . Pleural effusion, left 06/22/2012   Family History  Problem Relation Age of Onset  . Emphysema Father    Past Surgical History:  Procedure Laterality Date  . CARPAL TUNNEL RELEASE     bil  . LUMBAR LAMINECTOMY  06/30/2011   Procedure: MICRODISCECTOMY LUMBAR LAMINECTOMY;  Surgeon: Marybelle Killings;  Location: [place];  Service: Orthopedics;  Laterality: Left;  Left L5-S1 Microdiscectomy  . LUNG CANCER SURGERY  October, 2013   [facility]   Social History   Occupational History  . Not on file.   Social History Main Topics  . Smoking status: Current Some Day Smoker    Packs/day: 1.50  . Smokeless tobacco: Never Used  . Alcohol use 2.4 - 4.8 oz/week    1 - 2 Glasses of wine, 3 - 6 Cans of beer per week  . Drug use: No  . Sexual activity: Not on file

## 2016-10-23 NOTE — Procedures (Signed)
Lumbosacral Transforaminal Epidural Steroid Injection - Infraneural Approach with Fluoroscopic Guidance  Patient: Johnny Contreras      Date of Birth: 1960/07/13 MRN: [id number] PCP: Lorelee Market, MD      Visit Date: 10/23/2016   Universal Protocol:    Date/Time: 04/30/186:08 AM  Consent Given By: the patient  Position: PRONE   Additional Comments: Vital signs were monitored before and after the procedure. Patient was prepped and draped in the usual sterile fashion. The correct patient, procedure, and site was verified.   Injection Procedure Details:  Procedure Site One Meds Administered: No orders of the defined types were placed in this encounter.     Laterality: Bilateral  Location/Site:  L5-S1  Needle size: 22 G  Needle type: Spinal  Needle Placement: Transforaminal  Findings:  -Contrast Used: 1 mL iohexol 180 mg iodine/mL   -Comments: Excellent flow of contrast along the nerve and into the epidural space.  Procedure Details: After squaring off the end-plates of the desired vertebral level to get a true AP view, the C-arm was obliqued to the painful side so that the superior articulating process is positioned about 1/3 the length of the inferior endplate.  The needle was aimed toward the junction of the superior articular process and the transverse process of the inferior vertebrae. The needle's initial entry is in the lower third of the foramen through Kambin's triangle. The soft tissues overlying this target were infiltrated with 2-3 ml. of 1% Lidocaine without Epinephrine.  The spinal needle was then inserted and advanced toward the target using a "trajectory" view along the fluoroscope beam.  Under AP and lateral visualization, the needle was advanced so it did not puncture dura and did not traverse medially beyond the 6 o'clock position of the pedicle. Bi-planar projections were used to confirm position. Aspiration was confirmed to be negative for CSF and/or  blood. A 1-2 ml. volume of Isovue-250 was injected and flow of contrast was noted at each level. Radiographs were obtained for documentation purposes.   After attaining the desired flow of contrast documented above, a 0.5 to 1.0 ml test dose of 0.25% Marcaine was injected into each respective transforaminal space.  The patient was observed for 90 seconds post injection.  After no sensory deficits were reported, and normal lower extremity motor function was noted,   the above injectate was administered so that equal amounts of the injectate were placed at each foramen (level) into the transforaminal epidural space.   Additional Comments:  The patient tolerated the procedure well Dressing: Band-Aid    Post-procedure details: Patient was observed during the procedure. Post-procedure instructions were reviewed.  Patient left the clinic in stable condition.

## 2016-10-23 NOTE — Progress Notes (Deleted)
Pain down back of both legs to knee. Pain with walking and standing. Relief with sitting. Pain waking him at night. Right lower leg numbness.

## 2016-11-15 ENCOUNTER — Ambulatory Visit (INDEPENDENT_AMBULATORY_CARE_PROVIDER_SITE_OTHER): Payer: Self-pay

## 2016-11-15 ENCOUNTER — Encounter (INDEPENDENT_AMBULATORY_CARE_PROVIDER_SITE_OTHER): Payer: Self-pay | Admitting: Orthopaedic Surgery

## 2016-11-15 ENCOUNTER — Encounter (INDEPENDENT_AMBULATORY_CARE_PROVIDER_SITE_OTHER): Payer: Self-pay

## 2016-11-15 ENCOUNTER — Ambulatory Visit (INDEPENDENT_AMBULATORY_CARE_PROVIDER_SITE_OTHER): Payer: Medicare Other | Admitting: Orthopaedic Surgery

## 2016-11-15 VITALS — Ht 67.0 in | Wt 180.0 lb

## 2016-11-15 DIAGNOSIS — M5416 Radiculopathy, lumbar region: Secondary | ICD-10-CM

## 2016-11-15 DIAGNOSIS — M25551 Pain in right hip: Secondary | ICD-10-CM

## 2016-11-15 DIAGNOSIS — M25552 Pain in left hip: Secondary | ICD-10-CM | POA: Diagnosis not present

## 2016-11-15 MED ORDER — TRAMADOL HCL 50 MG PO TABS: 50.0000 mg | ORAL_TABLET | Freq: Two times a day (BID) | ORAL | 0 refills | Status: DC | PRN

## 2016-11-15 NOTE — Progress Notes (Signed)
Office Visit Note   Patient: Johnny Contreras           Date of Birth: [birth year]           MRN: [id number] Visit Date: 11/15/2016              Requested by: Lorelee Market, [place], [place] [address] PCP: Lorelee Market, MD   Assessment & Plan: Visit Diagnoses:  1. Bilateral hip pain   2. Radiculopathy, lumbar region     Plan: We'll schedule patient for repeat lumbar ESI. Follow-up a few weeks after that is done check his response. Return sooner if needed. Recommended he follow-up this pulmonary specialist for chest tightness that he is describing.  Follow-Up Instructions: Return in about 4 weeks (around 12/13/2016).   Orders:  Orders Placed This Encounter  Procedures  . XR HIP UNILAT W OR W/O PELVIS 2-3 VIEWS LEFT  . XR HIP UNILAT W OR W/O PELVIS 2-3 VIEWS RIGHT  . Ambulatory referral to Physical Medicine Rehab   Meds ordered this encounter  Medications  . traMADol (ULTRAM) 50 MG tablet    Sig: Take 1 tablet (50 mg total) by mouth every 12 (twelve) hours as needed for moderate pain.    Dispense:  90 tablet    Refill:  0      Procedures: No procedures performed   Clinical Data: No additional findings.   Subjective: Chief Complaint  Patient presents with  . Lower Back - Pain    HPI Patient returns after having lumbar ESI. States that this is helped some of his back pain. Continues to complain of bilateral hip and thigh pain. Pain with ambulation. Also continues to describe bilateral lower extremity neurogenic claudication symptoms. States that his walking distances have greatly decreased due to this.He does mention some issues with left-sided chest tightness. He does have a history of lung cancer and is scheduled for follow-up CT chest. Review of Systems  Constitutional: Positive for activity change.  Respiratory: Positive for chest tightness (Left-sided at times when he is using the bathroom.).   Gastrointestinal: Negative.     Musculoskeletal: Positive for back pain, gait problem and myalgias.  Neurological: Positive for weakness and numbness.  Psychiatric/Behavioral: Negative.      Objective: Vital Signs: Ht 5\' 7"  (1.702 m)   Wt 180 lb (81.6 kg)   BMI 28.19 kg/m   Physical Exam  Constitutional: He is oriented to person, place, and time. He appears well-developed. No distress.  HENT:  Head: Normocephalic and atraumatic.  Eyes: EOM are normal. Pupils are equal, round, and reactive to light.  Pulmonary/Chest: No respiratory distress.  Musculoskeletal:  Gait is antalgic. Bilateral lumbar paraspinal tenderness. He has bilateral hip and thigh pain with internal/external rotation. Bilateral calves nontender. Neurovascular intact. No focal motor deficits.  Neurological: He is alert and oriented to person, place, and time.    Ortho Exam  Specialty Comments:  No specialty comments available.  Imaging: No results found.   PMFS History: Patient Active Problem List   Diagnosis Date Noted  . Acute back pain with sciatica, left 06/16/2016  . Hepatitis C 12/17/2014  . Pleural effusion, left 06/22/2012  . Hypertension 06/22/2012  . Alcoholism /alcohol abuse ([place]) 06/22/2012  . Tobacco abuse 06/22/2012  . Lung cancer ([place]) 06/22/2012  . Pleuritic chest pain 06/22/2012  . Arthritis 06/22/2012  . Acute kidney injury ([place]) 06/22/2012  . GERD (gastroesophageal reflux disease) 06/22/2012  . Cancer of lower lobe of lung ([place]) 03/20/2012  .  Back ache 02/28/2012  . CAFL (chronic airflow limitation) ([place]) 01/25/2012  . HNP (herniated nucleus pulposus), lumbar 06/30/2011    Class: Diagnosis of   Past Medical History:  Diagnosis Date  . Acute kidney injury ([place]) 06/22/2012  . Alcoholism /alcohol abuse ([place]) 06/22/2012   Daily beers and 'Hennessey' (cognac)   . Arthritis   . Cancer of lower lobe of lung ([place]) 03/20/2012  . Chronic kidney disease    kidney stones ?  Marland Kitchen GERD (gastroesophageal reflux disease)    . Hepatitis C   . Hypertension   . Lung cancer [facility]) October, 2013   Surgery and Chemo at [facility]  . Pleural effusion, left 06/22/2012    Family History  Problem Relation Age of Onset  . Emphysema Father     Past Surgical History:  Procedure Laterality Date  . CARPAL TUNNEL RELEASE     bil  . LUMBAR LAMINECTOMY  06/30/2011   Procedure: MICRODISCECTOMY LUMBAR LAMINECTOMY;  Surgeon: Marybelle Killings;  Location: [place];  Service: Orthopedics;  Laterality: Left;  Left L5-S1 Microdiscectomy  . LUNG CANCER SURGERY  October, 2013   [facility]   Social History   Occupational History  . Not on file.   Social History Main Topics  . Smoking status: Current Some Day Smoker    Packs/day: 1.50  . Smokeless tobacco: Never Used  . Alcohol use 2.4 - 4.8 oz/week    1 - 2 Glasses of wine, 3 - 6 Cans of beer per week  . Drug use: No  . Sexual activity: Not on file

## 2016-11-28 ENCOUNTER — Other Ambulatory Visit (INDEPENDENT_AMBULATORY_CARE_PROVIDER_SITE_OTHER): Payer: Self-pay | Admitting: Orthopaedic Surgery

## 2016-11-28 DIAGNOSIS — M545 Low back pain: Secondary | ICD-10-CM

## 2016-11-28 NOTE — Telephone Encounter (Signed)
Ok for refill? 

## 2016-12-19 ENCOUNTER — Encounter (INDEPENDENT_AMBULATORY_CARE_PROVIDER_SITE_OTHER): Payer: Self-pay

## 2016-12-22 ENCOUNTER — Encounter (INDEPENDENT_AMBULATORY_CARE_PROVIDER_SITE_OTHER): Payer: Self-pay

## 2016-12-26 ENCOUNTER — Ambulatory Visit (INDEPENDENT_AMBULATORY_CARE_PROVIDER_SITE_OTHER): Payer: Medicare Other | Admitting: Orthopaedic Surgery

## 2017-01-03 ENCOUNTER — Other Ambulatory Visit (INDEPENDENT_AMBULATORY_CARE_PROVIDER_SITE_OTHER): Payer: Self-pay | Admitting: Surgery

## 2017-01-03 NOTE — Telephone Encounter (Signed)
Tramadol refill request 

## 2017-01-03 NOTE — Telephone Encounter (Signed)
Cannot refill until he contacts them to schedule injection as recommended.

## 2017-01-03 NOTE — Telephone Encounter (Signed)
did patient have lumbar ESI and when his he's scheduled to follow-up?

## 2017-01-03 NOTE — Telephone Encounter (Signed)
Doesn't look like they have been able to reach patient to sched.  They have mailed a letter to patient but he has not returned call

## 2017-01-08 ENCOUNTER — Telehealth (INDEPENDENT_AMBULATORY_CARE_PROVIDER_SITE_OTHER): Payer: Self-pay | Admitting: Physical Medicine and Rehabilitation

## 2017-01-08 ENCOUNTER — Telehealth (INDEPENDENT_AMBULATORY_CARE_PROVIDER_SITE_OTHER): Payer: Self-pay

## 2017-01-08 NOTE — Telephone Encounter (Signed)
[facility]ol rf [facility] [place]  Or will pick up if needed

## 2017-01-09 MED ORDER — TRAMADOL HCL 50 MG PO TABS: 50.0000 mg | ORAL_TABLET | Freq: Two times a day (BID) | ORAL | 0 refills | Status: DC | PRN

## 2017-01-09 NOTE — Telephone Encounter (Signed)
Please call and enter

## 2017-01-09 NOTE — Telephone Encounter (Signed)
OK for # 30 ultram      One po bid prn pain.

## 2017-01-09 NOTE — Telephone Encounter (Signed)
[place] for refill on Tramadol?

## 2017-01-10 NOTE — Telephone Encounter (Signed)
Entered and called to pharm, tried to reach patient last night to advise and no VM available.

## 2017-01-15 ENCOUNTER — Ambulatory Visit (INDEPENDENT_AMBULATORY_CARE_PROVIDER_SITE_OTHER): Payer: Self-pay

## 2017-01-15 ENCOUNTER — Ambulatory Visit (INDEPENDENT_AMBULATORY_CARE_PROVIDER_SITE_OTHER): Payer: Medicare Other | Admitting: Physical Medicine and Rehabilitation

## 2017-01-15 VITALS — BP 183/107 | HR 91 | Temp 98.0°F

## 2017-01-15 DIAGNOSIS — G894 Chronic pain syndrome: Secondary | ICD-10-CM

## 2017-01-15 DIAGNOSIS — M5416 Radiculopathy, lumbar region: Secondary | ICD-10-CM | POA: Diagnosis not present

## 2017-01-15 DIAGNOSIS — M961 Postlaminectomy syndrome, not elsewhere classified: Secondary | ICD-10-CM

## 2017-01-15 MED ORDER — LIDOCAINE HCL (PF) 1 % IJ SOLN
2.0000 mL | Freq: Once | INTRAMUSCULAR | Status: AC
  Administered 2017-01-15: 2 mL

## 2017-01-15 MED ORDER — METHYLPREDNISOLONE ACETATE 80 MG/ML IJ SUSP
80.0000 mg | Freq: Once | INTRAMUSCULAR | Status: AC
  Administered 2017-01-15: 80 mg

## 2017-01-15 NOTE — Patient Instructions (Signed)

## 2017-01-15 NOTE — Progress Notes (Unsigned)
Fluoro Time: 41 sec MGy: 35.52

## 2017-01-15 NOTE — Progress Notes (Deleted)
R

## 2017-01-15 NOTE — Progress Notes (Deleted)
Pain across lower back. Pain down both legs to knee. Worse the longer he stands.  Off and on Stabbing pain in right knee.Constant left foot numbness.

## 2017-01-16 NOTE — Procedures (Signed)
Mr. Katich low back pain with referral pattern into the buttock bilaterally. He also get some referral pattern down both legs posteriorly to the knee. His symptoms are more classically an S1 distribution down the posterior hamstring. He does not really get that symptoms in the calves. He does report constant left foot numbness but this is been present since prior lumbar surgery. He is followed by Dr. Lorin Mercy. We completed bilateral L5 transforaminal injection several months ago and he says it helped to a degree. I think at this point the best approach is bilateral S1 transforaminal injections to see if that gives him any more relief. He'll follow up with Dr. Lorin Mercy accordingly. His history is complicated by history of lung cancer remotely.  S1 Lumbosacral Transforaminal Epidural Steroid Injection - Sub-Pedicular Approach with Fluoroscopic Guidance   Patient: Johnny Contreras      Date of Birth: 1960-08-27 MRN: [id number] PCP: Lorelee Market, MD      Visit Date: 01/15/2017   Universal Protocol:    Date/Time: 07/24/186:40 AM  Consent Given By: the patient  Position:  PRONE  Additional Comments: Vital signs were monitored before and after the procedure. Patient was prepped and draped in the usual sterile fashion. The correct patient, procedure, and site was verified.   Injection Procedure Details:  Procedure Site One Meds Administered:  Meds ordered this encounter  Medications  . lidocaine (PF) (XYLOCAINE) 1 % injection 2 mL  . methylPREDNISolone acetate (DEPO-MEDROL) injection 80 mg    Laterality: Bilateral  Location/Site:  S1 Foramen   Needle size: 22 G  Needle type: Spinal  Needle Placement: Transforaminal  Findings:  -Contrast Used: 1 mL iohexol 180 mg iodine/mL   -Comments: Excellent flow of contrast along the nerve and into the epidural space.  Procedure Details: After squaring off the sacral end-plate to get a true AP view, the C-arm was positioned so that the best  possible view of the S1 foramen was visualized. The soft tissues overlying this structure were infiltrated with 2-3 ml. of 1% Lidocaine without Epinephrine.    The spinal needle was inserted toward the target using a "trajectory" view along the fluoroscope beam.  Under AP and lateral visualization, the needle was advanced so it did not puncture dura. Biplanar projections were used to confirm position. Aspiration was confirmed to be negative for CSF and/or blood. A 1-2 ml. volume of Isovue-250 was injected and flow of contrast was noted at each level. Radiographs were obtained for documentation purposes.   After attaining the desired flow of contrast documented above, a 0.5 to 1.0 ml test dose of 0.25% Marcaine was injected into each respective transforaminal space.  The patient was observed for 90 seconds post injection.  After no sensory deficits were reported, and normal lower extremity motor function was noted,   the above injectate was administered so that equal amounts of the injectate were placed at each foramen (level) into the transforaminal epidural space.   Additional Comments:  The patient tolerated the procedure well Dressing: Band-Aid    Post-procedure details: Patient was observed during the procedure. Post-procedure instructions were reviewed.  Patient left the clinic in stable condition.

## 2017-02-28 ENCOUNTER — Other Ambulatory Visit (INDEPENDENT_AMBULATORY_CARE_PROVIDER_SITE_OTHER): Payer: Self-pay | Admitting: Orthopaedic Surgery

## 2017-02-28 DIAGNOSIS — M545 Low back pain: Secondary | ICD-10-CM

## 2017-02-28 NOTE — Telephone Encounter (Signed)
Ok for refill? 

## 2017-04-11 ENCOUNTER — Telehealth (INDEPENDENT_AMBULATORY_CARE_PROVIDER_SITE_OTHER): Payer: Self-pay | Admitting: Orthopaedic Surgery

## 2017-04-11 NOTE — Telephone Encounter (Signed)
Patient called asking for a refill on Tramadol. CB # [id number]

## 2017-04-11 NOTE — Telephone Encounter (Signed)
Ok for refill? 

## 2017-04-12 MED ORDER — TRAMADOL HCL 50 MG PO TABS: 50.0000 mg | ORAL_TABLET | Freq: Two times a day (BID) | ORAL | 0 refills | Status: DC | PRN

## 2017-04-12 NOTE — Addendum Note (Signed)
Addended by: Meyer Cory on: 04/12/2017 04:56 PM   Modules accepted: Orders

## 2017-04-12 NOTE — Addendum Note (Signed)
Addended by: Meyer Cory on: 04/12/2017 12:20 PM   Modules accepted: Orders

## 2017-04-12 NOTE — Telephone Encounter (Signed)
Tried to reach patient numerous times but voicemail is not set up. [facility] called [facility]

## 2017-04-12 NOTE — Telephone Encounter (Signed)
Ok to refill # 30 ultram thanks  One po bid prn pain

## 2017-04-24 ENCOUNTER — Ambulatory Visit (INDEPENDENT_AMBULATORY_CARE_PROVIDER_SITE_OTHER): Payer: Medicare Other | Admitting: Orthopaedic Surgery

## 2017-04-24 ENCOUNTER — Ambulatory Visit (INDEPENDENT_AMBULATORY_CARE_PROVIDER_SITE_OTHER): Payer: Medicare Other

## 2017-04-24 ENCOUNTER — Encounter (INDEPENDENT_AMBULATORY_CARE_PROVIDER_SITE_OTHER): Payer: Self-pay | Admitting: Orthopaedic Surgery

## 2017-04-24 VITALS — BP 162/95 | HR 110 | Ht 67.0 in | Wt 180.0 lb

## 2017-04-24 DIAGNOSIS — G8929 Other chronic pain: Secondary | ICD-10-CM

## 2017-04-24 DIAGNOSIS — M545 Low back pain: Secondary | ICD-10-CM

## 2017-04-24 DIAGNOSIS — Q7649 Other congenital malformations of spine, not associated with scoliosis: Secondary | ICD-10-CM

## 2017-04-24 NOTE — Progress Notes (Signed)
Office Visit Note   Patient: Johnny Contreras           Date of Birth: September 24, 1960           MRN: [id number] Visit Date: 04/24/2017              Requested by: Lorelee Market, [place], [place] [address] PCP: Lorelee Market, MD   Assessment & Plan: Visit Diagnoses:  1. Chronic bilateral low back pain, with sciatica presence unspecified   2. Congenital stenosis of lumbar spine     Plan: We discussed options with him. He gets relief with sitting up try to work on leg strengthening walking until his legs start to hurt sit and rest and then repeat. I'll recheck him in 4 months. We discussed multilevel decompression surgery option. He will return in 4 months.  Follow-Up Instructions: Return in about 4 months (around 08/23/2017).   Orders:  Orders Placed This Encounter  Procedures  . XR Lumbar Spine 2-3 Views   No orders of the defined types were placed in this encounter.     Procedures: No procedures performed   Clinical Data: No additional findings.   Subjective: Chief Complaint  Patient presents with  . Right Leg - Pain  . Left Leg - Pain    HPI 56 year old male returns she is now 5 years out from lung cancer with partial resection and has had no evidence of recurrence. Used to be a heavy drinker but stopped. He has continued problems with a neurogenic claudication with congenital stenosis of the lumbar spine from L3-L5. Previous left microdiscectomy foraminotomy in 2013 gave him some relief of his leg pain. He has some disc bulging on the right at L3-4 with intermittent pain in the right anterior thigh that comes and goes. He gets relief with sitting. He has problems when he stands for a period of time maybe 10 minutes and has the ability to walk about a block and then he sits and rests and then can repeat. ABIs were 0.9-1.06 range.  Review of Systems History of cancer left lower lobe of the lung., GERD, acute kidney injury, tobacco abuse in the past,  history of alcoholism, hypertension, congenital lumbar stenosis otherwise negative as it pertains history of present illness.  Objective: Vital Signs: BP (!) 162/95   Pulse (!) 110   Ht 5\' 7"  (1.702 m)   Wt 180 lb (81.6 kg)   BMI 28.19 kg/m   Physical Exam  Constitutional: He is oriented to person, place, and time. He appears well-developed and well-nourished.  HENT:  Head: Normocephalic and atraumatic.  Eyes: Pupils are equal, round, and reactive to light. EOM are normal.  Neck: No tracheal deviation present. No thyromegaly present.  Cardiovascular: Normal rate.   Pulmonary/Chest: Effort normal. He has no wheezes.  Abdominal: Soft. Bowel sounds are normal.  Neurological: He is alert and oriented to person, place, and time.  Skin: Skin is warm and dry. Capillary refill takes less than 2 seconds.  Psychiatric: He has a normal mood and affect. His behavior is normal. Judgment and thought content normal.    Ortho Exam patient ambulates without a limp. Anterior tib EHL is strong. No quad weakness. He can ambulate without limping. Reflexes are 2+ and symmetrical no pain with hip range of motion. Well-healed lumbar incision. Distal pulses palpable.   Specialty Comments:  No specialty comments available.  Imaging: No results found.   PMFS History: Patient Active Problem List   Diagnosis Date Noted  .  Acute back pain with sciatica, left 06/16/2016  . Hepatitis C 12/17/2014  . Pleural effusion, left 06/22/2012  . Hypertension 06/22/2012  . Alcoholism /alcohol abuse ([place]) 06/22/2012  . Tobacco abuse 06/22/2012  . Lung cancer ([place]) 06/22/2012  . Pleuritic chest pain 06/22/2012  . Arthritis 06/22/2012  . Acute kidney injury ([place]) 06/22/2012  . GERD (gastroesophageal reflux disease) 06/22/2012  . Cancer of lower lobe of lung ([place]) 03/20/2012  . Back ache 02/28/2012  . CAFL (chronic airflow limitation) ([place]) 01/25/2012  . HNP (herniated nucleus pulposus), lumbar 06/30/2011     Class: Diagnosis of   Past Medical History:  Diagnosis Date  . Acute kidney injury ([place]) 06/22/2012  . Alcoholism /alcohol abuse ([place]) 06/22/2012   Daily beers and 'Hennessey' (cognac)   . Arthritis   . Cancer of lower lobe of lung ([place]) 03/20/2012  . Chronic kidney disease    kidney stones ?  Marland Kitchen GERD (gastroesophageal reflux disease)   . Hepatitis C   . Hypertension   . Lung cancer [facility]) October, 2013   Surgery and Chemo at [facility]  . Pleural effusion, left 06/22/2012    Family History  Problem Relation Age of Onset  . Emphysema Father     Past Surgical History:  Procedure Laterality Date  . CARPAL TUNNEL RELEASE     bil  . LUMBAR LAMINECTOMY  06/30/2011   Procedure: MICRODISCECTOMY LUMBAR LAMINECTOMY;  Surgeon: Marybelle Killings;  Location: [place];  Service: Orthopedics;  Laterality: Left;  Left L5-S1 Microdiscectomy  . LUNG CANCER SURGERY  October, 2013   [facility]   Social History   Occupational History  . Not on file.   Social History Main Topics  . Smoking status: Current Some Day Smoker    Packs/day: 1.50  . Smokeless tobacco: Never Used  . Alcohol use 2.4 - 4.8 oz/week    1 - 2 Glasses of wine, 3 - 6 Cans of beer per week  . Drug use: No  . Sexual activity: Not on file

## 2017-05-11 ENCOUNTER — Other Ambulatory Visit (INDEPENDENT_AMBULATORY_CARE_PROVIDER_SITE_OTHER): Payer: Self-pay | Admitting: Orthopaedic Surgery

## 2017-05-11 NOTE — Telephone Encounter (Signed)
Ok for refill? 

## 2017-05-11 NOTE — Telephone Encounter (Signed)
Ok thanks 

## 2017-05-14 NOTE — Telephone Encounter (Signed)
Called to pharmacy 

## 2017-05-28 ENCOUNTER — Other Ambulatory Visit (INDEPENDENT_AMBULATORY_CARE_PROVIDER_SITE_OTHER): Payer: Self-pay | Admitting: Orthopaedic Surgery

## 2017-05-28 DIAGNOSIS — M545 Low back pain: Secondary | ICD-10-CM

## 2017-05-28 NOTE — Telephone Encounter (Signed)
Rx request 

## 2017-06-15 ENCOUNTER — Emergency Department (HOSPITAL_COMMUNITY)
Admission: EM | Admit: 2017-06-15 | Discharge: 2017-06-15 | Disposition: A | Discharge status: Home / Self Care | Payer: Medicare Other | Attending: Emergency Medicine | Admitting: Emergency Medicine

## 2017-06-15 ENCOUNTER — Emergency Department (HOSPITAL_COMMUNITY): Payer: Medicare Other

## 2017-06-15 DIAGNOSIS — Z85118 Personal history of other malignant neoplasm of bronchus and lung: Secondary | ICD-10-CM | POA: Insufficient documentation

## 2017-06-15 DIAGNOSIS — F17200 Nicotine dependence, unspecified, uncomplicated: Secondary | ICD-10-CM | POA: Diagnosis not present

## 2017-06-15 DIAGNOSIS — I129 Hypertensive chronic kidney disease with stage 1 through stage 4 chronic kidney disease, or unspecified chronic kidney disease: Secondary | ICD-10-CM | POA: Insufficient documentation

## 2017-06-15 DIAGNOSIS — Y939 Activity, unspecified: Secondary | ICD-10-CM | POA: Diagnosis not present

## 2017-06-15 DIAGNOSIS — N189 Chronic kidney disease, unspecified: Secondary | ICD-10-CM | POA: Diagnosis not present

## 2017-06-15 DIAGNOSIS — R319 Hematuria, unspecified: Secondary | ICD-10-CM | POA: Diagnosis not present

## 2017-06-15 DIAGNOSIS — R911 Solitary pulmonary nodule: Secondary | ICD-10-CM | POA: Diagnosis not present

## 2017-06-15 DIAGNOSIS — S299XXA Unspecified injury of thorax, initial encounter: Secondary | ICD-10-CM | POA: Diagnosis present

## 2017-06-15 DIAGNOSIS — S2242XA Multiple fractures of ribs, left side, initial encounter for closed fracture: Secondary | ICD-10-CM

## 2017-06-15 DIAGNOSIS — Z79899 Other long term (current) drug therapy: Secondary | ICD-10-CM | POA: Insufficient documentation

## 2017-06-15 DIAGNOSIS — W01198A Fall on same level from slipping, tripping and stumbling with subsequent striking against other object, initial encounter: Secondary | ICD-10-CM | POA: Insufficient documentation

## 2017-06-15 DIAGNOSIS — Y92008 Other place in unspecified non-institutional (private) residence as the place of occurrence of the external cause: Secondary | ICD-10-CM | POA: Diagnosis not present

## 2017-06-15 DIAGNOSIS — Y999 Unspecified external cause status: Secondary | ICD-10-CM | POA: Insufficient documentation

## 2017-06-15 LAB — URINALYSIS, ROUTINE W REFLEX MICROSCOPIC
BACTERIA UA: NONE SEEN
BILIRUBIN URINE: NEGATIVE
Glucose, UA: NEGATIVE mg/dL
KETONES UR: NEGATIVE mg/dL
Nitrite: NEGATIVE
Protein, ur: 30 mg/dL — AB
Specific Gravity, Urine: 1.016 (ref 1.005–1.030)
pH: 5 (ref 5.0–8.0)

## 2017-06-15 LAB — I-STAT CHEM 8, ED
BUN: 23 mg/dL — ABNORMAL HIGH (ref 6–20)
CREATININE: 1.5 mg/dL — AB (ref 0.61–1.24)
Calcium, Ion: 1.16 mmol/L (ref 1.15–1.40)
Chloride: 103 mmol/L (ref 101–111)
Glucose, Bld: 105 mg/dL — ABNORMAL HIGH (ref 65–99)
HEMATOCRIT: 54 % — AB (ref 39.0–52.0)
HEMOGLOBIN: 18.4 g/dL — AB (ref 13.0–17.0)
Potassium: 4.3 mmol/L (ref 3.5–5.1)
SODIUM: 141 mmol/L (ref 135–145)
TCO2: 28 mmol/L (ref 22–32)

## 2017-06-15 MED ORDER — MORPHINE SULFATE (PF) 4 MG/ML IV SOLN
4.0000 mg | Freq: Once | INTRAVENOUS | Status: AC
  Administered 2017-06-15: 4 mg via INTRAVENOUS

## 2017-06-15 MED ORDER — MORPHINE SULFATE (PF) 4 MG/ML IV SOLN
4.0000 mg | Freq: Once | INTRAVENOUS | Status: DC
  Filled 2017-06-15: qty 1

## 2017-06-15 MED ORDER — OXYCODONE-ACETAMINOPHEN 5-325 MG PO TABS: 1.0000 | ORAL_TABLET | ORAL | 0 refills | Status: AC | PRN

## 2017-06-15 MED ORDER — IOPAMIDOL (ISOVUE-300) INJECTION 61%
INTRAVENOUS | Status: AC
  Filled 2017-06-15: qty 75

## 2017-06-15 MED ORDER — LIDOCAINE 5 % EX PTCH
1.0000 | MEDICATED_PATCH | Freq: Once | CUTANEOUS | Status: DC
  Administered 2017-06-15: 1 via TRANSDERMAL
  Filled 2017-06-15: qty 1

## 2017-06-15 MED ORDER — HYDROMORPHONE HCL 1 MG/ML IJ SOLN
0.5000 mg | Freq: Once | INTRAMUSCULAR | Status: AC
  Administered 2017-06-15: 0.5 mg via INTRAVENOUS
  Filled 2017-06-15: qty 1

## 2017-06-15 NOTE — ED Triage Notes (Signed)
Pt arrived from home after falling on his front steps after being pulled by his leashed dog. Pt states he struck his left flank and lumbar region of his back on the step. Pt rates his pain at 10/10 at time of triage. Pt denies LOC, did not strike his head and denies taking blood thinners. Pt is alert and oriented x4.

## 2017-06-15 NOTE — ED Notes (Signed)
Pt taken to restroom for urine collection

## 2017-06-15 NOTE — Discharge Instructions (Signed)
You have a lung nodule that was noticed on your imaging in the emergency room today. It is very important that you let your primary care doctor know about this so they can get the records from your ED visist and decide when you will need another study to check on it's growth.   Take percocet for breakthrough pain, do not drink alcohol, drive, care for children or do other critical tasks while taking percocet.   It is very important that you take deep breaths to prevent lung collapse and infection.  Either use your incentive spirometer or take 10 deep breaths every hour to prevent lung collapse.  If you develop cough, fever or shortness of breath return immediately to the emergency room.

## 2017-06-15 NOTE — ED Provider Notes (Signed)
[facility] EMERGENCY DEPARTMENT Provider Note   CSN: [id number] Arrival date & time: 06/15/17  7622     History   Chief Complaint Chief Complaint  Patient presents with  . Fall   HPI   Blood pressure (!) 146/101, pulse 77, temperature 97.8 F (36.6 C), temperature source Oral, resp. rate 16, height 5\' 7"  (1.702 m), weight 83.9 kg (185 lb), SpO2 97 %.  Johnny Contreras is a 56 y.o. male complaining of severe left flank pain after falling earlier this morning he was on the step of his porch, his feet went out from under him and he fell backwards hitting his posterior ribs.  He is not anticoagulated, there is no head trauma, LOC, neck pain.  He does not endorse any shortness of breath, abdominal pain, hematuria or difficulty moving major joints.  He states he has a severe pain when he takes a deep breath.  No pain medications taken prior to arrival.  Past Medical History:  Diagnosis Date  . Acute kidney injury ([place]) 06/22/2012  . Alcoholism /alcohol abuse ([place]) 06/22/2012   Daily beers and 'Hennessey' (cognac)   . Arthritis   . Cancer of lower lobe of lung ([place]) 03/20/2012  . Chronic kidney disease    kidney stones ?  Marland Kitchen GERD (gastroesophageal reflux disease)   . Hepatitis C   . Hypertension   . Lung cancer [facility]) October, 2013   Surgery and Chemo at [facility]  . Pleural effusion, left 06/22/2012    Patient Active Problem List   Diagnosis Date Noted  . Acute back pain with sciatica, left 06/16/2016  . Hepatitis C 12/17/2014  . Pleural effusion, left 06/22/2012  . Hypertension 06/22/2012  . Alcoholism /alcohol abuse ([place]) 06/22/2012  . Tobacco abuse 06/22/2012  . Lung cancer ([place]) 06/22/2012  . Pleuritic chest pain 06/22/2012  . Arthritis 06/22/2012  . Acute kidney injury ([place]) 06/22/2012  . GERD (gastroesophageal reflux disease) 06/22/2012  . Cancer of lower lobe of lung ([place]) 03/20/2012  . Back ache 02/28/2012  . CAFL (chronic airflow limitation) ([place])  01/25/2012  . HNP (herniated nucleus pulposus), lumbar 06/30/2011    Class: Diagnosis of    Past Surgical History:  Procedure Laterality Date  . CARPAL TUNNEL RELEASE     bil  . LUMBAR LAMINECTOMY  06/30/2011   Procedure: MICRODISCECTOMY LUMBAR LAMINECTOMY;  Surgeon: Marybelle Killings;  Location: [facility];  Service: Orthopedics;  Laterality: Left;  Left L5-S1 Microdiscectomy  . LUNG CANCER SURGERY  October, 2013   [facility]       Home Medications    Prior to Admission medications   Medication Sig Start Date End Date Taking? Authorizing Provider  AMLODIPINE BESYLATE PO Take by mouth.    [provider]  cyclobenzaprine (FLEXERIL) 10 MG tablet TAKE 1 TABLET BY MOUTH AT BEDTIME AS NEEDED 05/28/17   Marybelle Killings, MD  GABAPENTIN PO Take by mouth.    [provider]  LISINOPRIL PO Take by mouth.    [provider]  OMEPRAZOLE PO Take by mouth.    [provider]  oxyCODONE (OXY IR/ROXICODONE) 5 MG immediate release tablet Take 5 mg by mouth every 4 (four) hours as needed. 10/23/14   [provider]  oxyCODONE-acetaminophen (PERCOCET) 5-325 MG tablet Take 1 tablet by mouth every 4 (four) hours as needed. 06/15/17   Kohle Winner, Elmyra Ricks, PA-C  predniSONE (STERAPRED UNI-PAK 21 TAB) 10 MG (21) TBPK tablet Take by mouth daily. As directed Patient not taking:  Reported on 11/15/2016 10/03/16   Marybelle Killings, MD  ranitidine (ZANTAC) 150 MG capsule Take by mouth.    [provider]  traMADol (ULTRAM) 50 MG tablet TAKE ONE TABLET BY MOUTH EVERY 12 HOURS AS NEEDED 05/14/17   Marybelle Killings, MD    Family History Family History  Problem Relation Age of Onset  . Emphysema Father     Social History Social History   Tobacco Use  . Smoking status: Current Some Day Smoker    Packs/day: 1.50  . Smokeless tobacco: Never Used  Substance Use Topics  . Alcohol use: Yes    Alcohol/week: 2.4 - 4.8 oz    Types: 1 - 2 Glasses of wine, 3 - 6 Cans of beer per week  .  Drug use: No     Allergies   Amitriptyline   Review of Systems Review of Systems   Physical Exam Updated Vital Signs BP (!) 134/117 (BP Location: Left Arm) Comment: Simultaneous filing. User may not have seen previous data.  Pulse 68 Comment: Simultaneous filing. User may not have seen previous data.  Temp 97.7 F (36.5 C) (Oral)   Resp 14   Ht 5\' 7"  (1.702 m)   Wt 83.9 kg (185 lb)   SpO2 97% Comment: Simultaneous filing. User may not have seen previous data.  BMI 28.98 kg/m   Physical Exam  Constitutional: He is oriented to person, place, and time. He appears well-developed and well-nourished. No distress.  HENT:  Head: Normocephalic and atraumatic.  Mouth/Throat: Oropharynx is clear and moist.  Eyes: Conjunctivae and EOM are normal. Pupils are equal, round, and reactive to light.  Neck: Normal range of motion.  Cardiovascular: Normal rate, regular rhythm and intact distal pulses.  Pulmonary/Chest: Effort normal and breath sounds normal. He exhibits tenderness.  Abdominal: Soft. There is no tenderness.  Musculoskeletal: Normal range of motion.       Back:  Point tender as diagrammed, no underlying crepitance, no ecchymosis lung sounds clear to auscultation bilaterally.  Neurological: He is alert and oriented to person, place, and time.  Skin: He is not diaphoretic.  Psychiatric: He has a normal mood and affect.  Nursing note and vitals reviewed.    ED Treatments / Results  Labs (all labs ordered are listed, but only abnormal results are displayed) Labs Reviewed  URINALYSIS, ROUTINE W REFLEX MICROSCOPIC - Abnormal; Notable for the following components:      Result Value   Hgb urine dipstick SMALL (*)    Protein, ur 30 (*)    Leukocytes, UA TRACE (*)    Squamous Epithelial / LPF 0-5 (*)    All other components within normal limits  I-STAT CHEM 8, ED - Abnormal; Notable for the following components:   BUN 23 (*)    Creatinine, Ser 1.50 (*)    Glucose, Bld 105  (*)    Hemoglobin 18.4 (*)    HCT 54.0 (*)    All other components within normal limits    EKG  EKG Interpretation None       Radiology Dg Ribs Unilateral W/chest Left  Result Date: 06/15/2017 CLINICAL DATA:  Left lateral rib pain after falling down the steps when his dog pulled him by the leash EXAM: LEFT RIBS AND CHEST - 3+ VIEW COMPARISON:  06/22/2012 FINDINGS: There is acute fracture of the distal aspect of the left eleventh rib, mildly displaced by just under when the shaft width. There is a probable additional nondisplaced fracture of the  left tenth rib. No other acute fractures. No rib lesions. There is widening of the space between the left fifth and sixth ribs, presumably postsurgical, stable from the prior study. Scarring is noted in the lateral left mid lung. There are no acute findings in the lungs. No pleural effusion or pneumothorax. Cardiac silhouette is normal in size. No mediastinal or hilar masses. IMPRESSION: 1. Acute mildly displaced fracture of the distal left eleventh rib and probable nondisplaced fracture of the left lateral tenth rib. 2. No fracture complication.  No pleural effusion or pneumothorax. Electronically Signed   By: Lajean Manes M.D.   On: 06/15/2017 10:39   Ct Chest W Contrast  Result Date: 06/15/2017 CLINICAL DATA:  Chest and abdomen pain secondary to a fall this morning. Rib fractures. Previous left lung cancer. Left upper lobectomy. EXAM: CT CHEST WITH CONTRAST TECHNIQUE: Multidetector CT imaging of the chest was performed during intravenous contrast administration. CONTRAST:  75 cc Isovue 300 COMPARISON:  X-rays dated 04/15/2017 FINDINGS: Cardiovascular: Aortic atherosclerosis and coronary artery calcifications. Overall heart size is normal. Mediastinum/Nodes: Previously left upper lobectomy.  No adenopathy. Lungs/Pleura: There is a new slightly lobulated 10 mm nodule in the right lung apex. There is a tiny calcified granuloma in the periphery of the  right middle lobe with a small adjacent area of parenchymal scarring, unchanged since 2013. Postsurgical changes in the left hemithorax from previous upper lobectomy. Peripheral scarring and bleb formation in the left lower lobe. Upper Abdomen: Normal. Musculoskeletal: No acute abnormality. The acutely fractured left eleventh rib is not included in this exam. Abnormal widening between the left fifth and sixth ribs is felt to be due to the prior surgery. IMPRESSION: 1. New slightly irregular 10 mm nodule in the right lung apex. Consider one of the following in 3 months for both low-risk and high-risk individuals: (a) repeat chest CT, (b) follow-up PET-CT, or (c) tissue sampling. This recommendation follows the consensus statement: Guidelines for Management of Incidental Pulmonary Nodules Detected on CT Images: From the Fleischner Society 2017; Radiology 2017; 284:228-243. 2. Postsurgical changes and scarring in the left hemithorax as described. Previous upper lobectomy. 3. Aortic atherosclerosis.  Coronary artery atherosclerosis. Electronically Signed   By: Lorriane Shire M.D.   On: 06/15/2017 13:53    Procedures Procedures (including critical care time)  Medications Ordered in ED Medications  lidocaine (LIDODERM) 5 % 1 patch (1 patch Transdermal Patch Applied 06/15/17 1415)  iopamidol (ISOVUE-300) 61 % injection (not administered)  morphine 4 MG/ML injection 4 mg (4 mg Intravenous Given 06/15/17 1031)  HYDROmorphone (DILAUDID) injection 0.5 mg (0.5 mg Intravenous Given 06/15/17 1137)     Initial Impression / Assessment and Plan / ED Course  I have reviewed the triage vital signs and the nursing notes.  Pertinent labs & imaging results that were available during my care of the patient were reviewed by me and considered in my medical decision making (see chart for details).     Vitals:   06/15/17 0923 06/15/17 1000 06/15/17 1200 06/15/17 1400  BP: 125/64 (!) 146/101 (!) 161/95 (!) 134/117    Pulse: 80 77 75 68  Resp: 16   14  Temp: 97.8 F (36.6 C)   97.7 F (36.5 C)  TempSrc: Oral   Oral  SpO2: 100% 97% 95% 97%  Weight: 83.9 kg (185 lb)     Height: 5\' 7"  (1.702 m)       Medications  lidocaine (LIDODERM) 5 % 1 patch (1 patch Transdermal Patch Applied  06/15/17 1415)  iopamidol (ISOVUE-300) 61 % injection (not administered)  morphine 4 MG/ML injection 4 mg (4 mg Intravenous Given 06/15/17 1031)  HYDROmorphone (DILAUDID) injection 0.5 mg (0.5 mg Intravenous Given 06/15/17 1137)    Abdullah Rizzi is 56 y.o. male presenting with severe left flank pain status post fall just prior to arrival.  Patient is point tender on the left lower ribs.  X-ray with mildly displaced 11th and possibly fractured 10th rib.  CT with a nodule on the right side, in this patient with prior lung cancer I have advised him to follow very closely with his oncologist and primary care with this.  Mild hematuria.  Patient given incentive spirometer, encouraged aggressive pain medication.   Evaluation does not show pathology that would require ongoing emergent intervention or inpatient treatment. Pt is hemodynamically stable and mentating appropriately. Discussed findings and plan with patient/guardian, who agrees with care plan. All questions answered. Return precautions discussed and outpatient follow up given.     Final Clinical Impressions(s) / ED Diagnoses   Final diagnoses:  Lung nodule  Closed fracture of multiple ribs of left side, initial encounter    ED Discharge Orders        Ordered    oxyCODONE-acetaminophen (PERCOCET) 5-325 MG tablet  Every 4 hours PRN     06/15/17 1357       Vibha Ferdig, Charna Elizabeth 06/15/17 [place], MD 06/15/17 2255

## 2017-07-04 ENCOUNTER — Ambulatory Visit (INDEPENDENT_AMBULATORY_CARE_PROVIDER_SITE_OTHER): Payer: Medicare Other | Admitting: Surgery

## 2017-07-04 ENCOUNTER — Telehealth (INDEPENDENT_AMBULATORY_CARE_PROVIDER_SITE_OTHER): Payer: Self-pay | Admitting: *Deleted

## 2017-07-04 ENCOUNTER — Encounter (INDEPENDENT_AMBULATORY_CARE_PROVIDER_SITE_OTHER): Payer: Self-pay | Admitting: Surgery

## 2017-07-04 ENCOUNTER — Ambulatory Visit
Admission: RE | Admit: 2017-07-04 | Discharge: 2017-07-04 | Disposition: A | Discharge status: Home / Self Care | Payer: Medicare Other | Source: Ambulatory Visit | Attending: Surgery | Admitting: Surgery

## 2017-07-04 VITALS — Ht 67.0 in | Wt 185.0 lb

## 2017-07-04 DIAGNOSIS — R0602 Shortness of breath: Secondary | ICD-10-CM

## 2017-07-04 DIAGNOSIS — S2242XA Multiple fractures of ribs, left side, initial encounter for closed fracture: Secondary | ICD-10-CM

## 2017-07-04 DIAGNOSIS — M5416 Radiculopathy, lumbar region: Secondary | ICD-10-CM

## 2017-07-04 MED ORDER — IOPAMIDOL (ISOVUE-300) INJECTION 61%
75.0000 mL | Freq: Once | INTRAVENOUS | Status: AC | PRN
Start: 2017-07-04 — End: 2017-07-04
  Administered 2017-07-04: 75 mL via INTRAVENOUS

## 2017-07-04 NOTE — Progress Notes (Signed)
Office Visit Note   Patient: Johnny Contreras           Date of Birth: Jun 17, 1961           MRN: [id number] Visit Date: 07/04/2017              Requested by: Lorelee [address], [place], [place] [address] PCP: Lorelee Market, MD   Assessment & Plan: Visit Diagnoses:  1. Radiculopathy, lumbar region   2. Closed fracture of multiple ribs of left side, initial encounter   3. Shortness of breath     Plan: With patient's worsening left posterior rib/flank pain we'll repeat CT chest. History of lung cancer and chronic shortness of breath cough but I am not certain as to whether or not he has worsening issues from his fall. We'll await callback report. In regards to his worsening low back pain and bilateral lower extremity radiculopathy with neurogenic claudication we'll schedule lumbar MRI with contrast and compare to study that was done January 2018. Follow-up with Dr. Lorin Mercy after completion to discuss results and further treatment options.  Follow-Up Instructions: Return in about 2 weeks (around 07/18/2017) for Review lumbar MRI scan with Dr. Lorin Mercy.   Orders:  Orders Placed This Encounter  Procedures  . CT CHEST W CONTRAST  . MR LUMBAR SPINE W CONTRAST   No orders of the defined types were placed in this encounter.     Procedures: No procedures performed   Clinical Data: No additional findings.   Subjective: Chief Complaint  Patient presents with  . Chest - Fracture    Rib fracture   . Lower Back - Pain    HPI Patient comes in with complaints of worsening left rib/flank pain and low back pain and bilateral lower extremity radiculopathy. Patient states that the Friday before Christmas he slipped on some ice on his porch falling directly onto his left low back. Immediate pain. He went to the emergency room for evaluation x-rays showed nondisplaced left 11th rib fracture and probable nondisplaced left 10th rib fracture. Patient has a history of lung cancer  and is scheduled to see his oncologist 07/13/2017. CT chest with contrast disorder 21st 2018 report read Slightly irregular 10 mm nodule in the right lung apex. Postsurgical changes and scarring in the left hemothorax. Previous upper lobectomy. Aortic atherosclerosis. Coronary artery atherosclerosis. Acutely fractured left 11th rib is not included in this exam.  Review of Systems   Objective: Vital Signs: Ht 5\' 7"  (1.702 m)   Wt 185 lb (83.9 kg)   BMI 28.98 kg/m   Physical Exam  Constitutional: He is oriented to person, place, and time. No distress.  HENT:  Head: Normocephalic and atraumatic.  Eyes: Pupils are equal, round, and reactive to light. EOM are normal.  Neck: Normal range of motion.  Pulmonary/Chest: No respiratory distress.  Musculoskeletal:  Lumbar paraspinal tenderness.  Negative logroll.  Negative straight leg raise.  No focal motor deficits.  Neurovascular intact  Neurological: He is alert and oriented to person, place, and time.  Skin: Skin is warm and dry.    Ortho Exam  Specialty Comments:  No specialty comments available.  Imaging: No results found.   PMFS History: Patient Active Problem List   Diagnosis Date Noted  . Acute back pain with sciatica, left 06/16/2016  . Hepatitis C 12/17/2014  . Pleural effusion, left 06/22/2012  . Hypertension 06/22/2012  . Alcoholism /alcohol abuse ([place]) 06/22/2012  . Tobacco abuse 06/22/2012  . Lung cancer ([place])  06/22/2012  . Pleuritic chest pain 06/22/2012  . Arthritis 06/22/2012  . Acute kidney injury ([place]) 06/22/2012  . GERD (gastroesophageal reflux disease) 06/22/2012  . Cancer of lower lobe of lung ([place]) 03/20/2012  . Back ache 02/28/2012  . CAFL (chronic airflow limitation) ([place]) 01/25/2012  . HNP (herniated nucleus pulposus), lumbar 06/30/2011    Class: Diagnosis of   Past Medical History:  Diagnosis Date  . Acute kidney injury ([place]) 06/22/2012  . Alcoholism /alcohol abuse ([place]) 06/22/2012   Daily  beers and 'Hennessey' (cognac)   . Arthritis   . Cancer of lower lobe of lung ([place]) 03/20/2012  . Chronic kidney disease    kidney stones ?  Marland Kitchen GERD (gastroesophageal reflux disease)   . Hepatitis C   . Hypertension   . Lung cancer [facility]) October, 2013   [facility] at [facility]  . Pleural effusion, left 06/22/2012    Family History  Problem Relation Age of Onset  . Emphysema Father     Past Surgical History:  Procedure Laterality Date  . CARPAL TUNNEL RELEASE     bil  . LUMBAR LAMINECTOMY  06/30/2011   Procedure: MICRODISCECTOMY LUMBAR LAMINECTOMY;  Surgeon: Marybelle Killings;  Location: [facility];  Service: Orthopedics;  Laterality: Left;  Left L5-S1 Microdiscectomy  . LUNG CANCER SURGERY  October, 2013   [facility]   Social History   Occupational History  . Not on file  Tobacco Use  . Smoking status: Current Some Day Smoker    Packs/day: 1.50  . Smokeless tobacco: Never Used  Substance and Sexual Activity  . Alcohol use: Yes    Alcohol/week: 2.4 - 4.8 oz    Types: 1 - 2 Glasses of wine, 3 - 6 Cans of beer per week  . Drug use: No  . Sexual activity: Not on file

## 2017-07-04 NOTE — Telephone Encounter (Signed)
Received Triage call from [facility] radiology with results of CT chest, they are as follows:  MPRESSION: 1. Slight interval increase in size of a 12 mm nodule in the right lung apex, concerning for primary lung cancer. Recommend PET-CT and/or tissue sampling for further evaluation. 2. Additional 14 mm nodular opacity in the anterior right upper lobe could also be evaluated by PET-CT. 3. Borderline enlarged right paratracheal lymph node, measuring 1.3 cm, unchanged. 4. Persistent mildly displaced left posterior tenth rib fracture without significant callus formation.  Order is also in [place].

## 2017-07-04 NOTE — Telephone Encounter (Signed)
FYI

## 2017-07-04 NOTE — Telephone Encounter (Signed)
They sent this to me by accident.

## 2017-07-05 NOTE — Telephone Encounter (Signed)
I tried calling patient , cannot leave voice mail he need see his cancer doctor and send copy of report to them.keep calling until U reach him.  thanks

## 2017-07-09 NOTE — Telephone Encounter (Signed)
Fyi.  I spoke with patient. He has an appointment with [facility] on 07/13/17. He has a MRI Lumbar that was ordered by Jeneen Rinks on 07/12/17.  He has appt scheduled with you to review MRI on 07/21/17.

## 2017-07-12 ENCOUNTER — Ambulatory Visit
Admission: RE | Admit: 2017-07-12 | Discharge: 2017-07-12 | Disposition: A | Discharge status: Home / Self Care | Payer: Medicare Other | Source: Ambulatory Visit | Attending: Surgery | Admitting: Surgery

## 2017-07-12 DIAGNOSIS — M5416 Radiculopathy, lumbar region: Secondary | ICD-10-CM

## 2017-07-12 MED ORDER — GADOBENATE DIMEGLUMINE 529 MG/ML IV SOLN
17.0000 mL | Freq: Once | INTRAVENOUS | Status: AC | PRN
  Administered 2017-07-12: 17 mL via INTRAVENOUS

## 2017-07-18 ENCOUNTER — Ambulatory Visit (INDEPENDENT_AMBULATORY_CARE_PROVIDER_SITE_OTHER): Payer: Medicare Other | Admitting: Orthopaedic Surgery

## 2017-07-18 ENCOUNTER — Encounter (INDEPENDENT_AMBULATORY_CARE_PROVIDER_SITE_OTHER): Payer: Self-pay | Admitting: Orthopaedic Surgery

## 2017-07-18 VITALS — BP 148/97 | HR 96 | Ht 67.0 in | Wt 184.0 lb

## 2017-07-18 DIAGNOSIS — M5442 Lumbago with sciatica, left side: Secondary | ICD-10-CM

## 2017-07-18 MED ORDER — TRAMADOL HCL 50 MG PO TABS: 50.0000 mg | ORAL_TABLET | Freq: Two times a day (BID) | ORAL | 0 refills | Status: AC | PRN

## 2017-07-18 NOTE — Progress Notes (Signed)
Office Visit Note   Patient: Johnny Contreras           Date of Birth: Nov 09, 1960           MRN: [id number] Visit Date: 07/18/2017              Requested by: Lorelee Market, [place], [place] [address] PCP: Lorelee Market, MD   Assessment & Plan: Visit Diagnoses:  1. Acute back pain with sciatica, left     Plan: He will keep his appointment with [facility] for evaluation of likely recurrent lung cancer.  Prescription for tramadol 30 tablets given 1 p.o. twice daily as needed.  He can follow-up here as needed.  MRI scan results were reviewed.  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  He can follow-up as needed.  Follow-Up Instructions: No Follow-up on file.   Orders:  No orders of the defined types were placed in this encounter.  No orders of the defined types were placed in this encounter.     Procedures: No procedures performed   Clinical Data: No additional findings.   Subjective: Chief Complaint  Patient presents with  . Lower Back - Pain, Follow-up    MRI review    HPI patient returns with ongoing problems with back pain.  MRI scan has been available and is reviewed today with him.  He also had a CT scan of his lung which showed 12 mm lesion and with his history of primary lung cancer he has an appointment for bronchoscopy at [facility] for biopsy and evaluation and is going there today.  Review of Systems 14 point review of systems updated unchanged from last office visit.   Objective: Vital Signs: BP (!) 148/97   Pulse 96   Ht 5\' 7"  (1.702 m)   Wt 184 lb (83.5 kg)   BMI 28.82 kg/m   Physical Exam  Constitutional: He is oriented to person, place, and time. He appears well-developed and well-nourished.  HENT:  Head: Normocephalic and atraumatic.  Patient has some mild dysphonia.  Eyes: EOM are normal. Pupils are equal, round, and reactive to light.  Neck: No tracheal  deviation present. No thyromegaly present.  Cardiovascular: Normal rate.  Pulmonary/Chest: Effort normal. He has no wheezes.  Abdominal: Soft. Bowel sounds are normal.  Neurological: He is alert and oriented to person, place, and time.  Skin: Skin is warm and dry. Capillary refill takes less than 2 seconds.  Psychiatric: He has a normal mood and affect. His behavior is normal. Judgment and thought content normal.    Ortho Exam patient is ambulatory.  Well-healed lumbar incision from previous surgery at left L5-S1 for H&P.  He is amatory negative straight leg raising 90 degrees.  Specialty Comments:  No specialty comments available.  Imaging: CLINICAL DATA:  Back pain, progressive symptoms. Severe right leg cramping. Prior surgery in 2014.  EXAM: MRI LUMBAR SPINE WITHOUT AND WITH CONTRAST  TECHNIQUE: Multiplanar and multiecho pulse sequences of the lumbar spine were obtained without and with intravenous contrast.  CONTRAST:  11mL MULTIHANCE GADOBENATE DIMEGLUMINE 529 MG/ML IV SOLN  COMPARISON:  07/03/2016  FINDINGS: Segmentation:  Standard.  Alignment:  Physiologic.  Vertebrae:  No fracture, evidence of discitis, or bone lesion.  Conus medullaris and cauda equina: Conus extends to the T12 level. Conus and cauda equina appear normal. Prominence of the epidural fat at L5 and S1 deforming the thecal sac as can  be seen with epidural lipomatosis.  Paraspinal and other soft tissues: No focal paraspinal abnormality.  Disc levels:  Disc spaces: Degenerative disc disease with severe disc height loss and reactive endplate changes at B7-S2.  T12-L1: No significant disc bulge. No evidence of neural foraminal stenosis. No central canal stenosis.  L1-L2: No significant disc bulge. No evidence of neural foraminal stenosis. No central canal stenosis.  L2-L3: Mild broad-based disc bulge. No evidence of neural foraminal stenosis. No central canal stenosis.  L3-L4:  Broad-based disc bulge with a right lateral disc component abutting the right extraforaminal L3 nerve root. Bilateral lateral recess stenosis. Moderate bilateral foraminal stenosis. No central canal stenosis.  L4-L5: Broad-based disc bulge flattening the ventral thecal sac. Moderate bilateral facet arthropathy. Moderate spinal stenosis. Mild bilateral foraminal stenosis.  L5-S1: Broad-based disc bulge with a more focal right paracentral disc osteophyte complex with mass effect on the right intraspinal S1 nerve root. Moderate left foraminal stenosis. Mild right foraminal stenosis. Moderate bilateral facet arthropathy with small effusions.  IMPRESSION: 1. At L3-4 there is a broad-based disc bulge with a right lateral disc component abutting the right extraforaminal L3 nerve root. Bilateral lateral recess stenosis. Moderate bilateral foraminal stenosis. 2. At L4-5 there is a broad-based disc bulge flattening the ventral thecal sac. Moderate bilateral facet arthropathy. Moderate spinal stenosis. Mild bilateral foraminal stenosis. 3. At L5-S1 there is a broad-based disc bulge with a more focal right paracentral disc osteophyte complex with mass effect on the right intraspinal S1 nerve root. Moderate left foraminal stenosis. Mild right foraminal stenosis. Moderate bilateral facet arthropathy with small effusions.   Electronically Signed   By: Kathreen Devoid   On: 07/13/2017 09:21  CLINICAL DATA:  Recent fall with left tenth and eleventh rib fractures, now with worsening pain. History of left upper lobectomy for lung cancer in 2013.  EXAM: CT CHEST WITH CONTRAST  TECHNIQUE: Multidetector CT imaging of the chest was performed during intravenous contrast administration.  CONTRAST:  12mL ISOVUE-300 IOPAMIDOL (ISOVUE-300) INJECTION 61%  COMPARISON:  CT chest dated June 15, 2017.  FINDINGS: Cardiovascular: No significant vascular findings. Normal heart size. No  pericardial effusion. Normal caliber thoracic aorta. Coronary artery atherosclerotic calcifications.  Mediastinum/Nodes: Borderline enlarged right paratracheal lymph node, measuring 1.3 cm in short axis, unchanged. No axillary or hilar lymphadenopathy. The thyroid gland, trachea, and esophagus demonstrate no significant findings.  Lungs/Pleura: Prior left upper lobectomy. Stable post radiation changes in the periphery of the left lower lobe. The previously seen right upper lobe lung nodule appears to have slightly increased in size, now measuring 12 x 10 mm, previously 10 x 9 mm. There is an additional 14 x 7 mm nodule in the anterior right upper lobe (series 5, image 45), also increased in size when compared to prior study.  No focal consolidation, pleural effusion, or pneumothorax.  Upper Abdomen: No acute abnormality.  Musculoskeletal: Persistent mildly displaced left posterior tenth rib fracture without significant callus formation.  IMPRESSION: 1. Slight interval increase in size of a 12 mm nodule in the right lung apex, concerning for primary lung cancer. Recommend PET-CT and/or tissue sampling for further evaluation. 2. Additional 14 mm nodular opacity in the anterior right upper lobe could also be evaluated by PET-CT. 3. Borderline enlarged right paratracheal lymph node, measuring 1.3 cm, unchanged. 4. Persistent mildly displaced left posterior tenth rib fracture without significant callus formation.  These results will be called to the ordering clinician or representative by the Radiologist Assistant, and communication documented in the  PACS or zVision Dashboard.   Electronically Signed   By: Titus Dubin M.D.   On: 07/04/2017 12:09 PMFS History: Patient Active Problem List   Diagnosis Date Noted  . Acute back pain with sciatica, left 06/16/2016  . Hepatitis C 12/17/2014  . Pleural effusion, left 06/22/2012  . Hypertension 06/22/2012  . Alcoholism  /alcohol abuse ([place]) 06/22/2012  . Tobacco abuse 06/22/2012  . Lung cancer ([place]) 06/22/2012  . Pleuritic chest pain 06/22/2012  . Arthritis 06/22/2012  . Acute kidney injury ([place]) 06/22/2012  . GERD (gastroesophageal reflux disease) 06/22/2012  . Cancer of lower lobe of lung ([place]) 03/20/2012  . Back ache 02/28/2012  . CAFL (chronic airflow limitation) ([place]) 01/25/2012  . HNP (herniated nucleus pulposus), lumbar 06/30/2011    Class: Diagnosis of   Past Medical History:  Diagnosis Date  . Acute kidney injury ([place]) 06/22/2012  . Alcoholism /alcohol abuse ([place]) 06/22/2012   Daily beers and 'Hennessey' (cognac)   . Arthritis   . Cancer of lower lobe of lung ([place]) 03/20/2012  . Chronic kidney disease    kidney stones ?  Marland Kitchen GERD (gastroesophageal reflux disease)   . Hepatitis C   . Hypertension   . Lung cancer [facility]) October, 2013   Surgery and Chemo at [facility]  . Pleural effusion, left 06/22/2012    Family History  Problem Relation Age of Onset  . Emphysema Father     Past Surgical History:  Procedure Laterality Date  . CARPAL TUNNEL RELEASE     bil  . LUMBAR LAMINECTOMY  06/30/2011   Procedure: MICRODISCECTOMY LUMBAR LAMINECTOMY;  Surgeon: Marybelle Killings;  Location: [place];  Service: Orthopedics;  Laterality: Left;  Left L5-S1 Microdiscectomy  . LUNG CANCER SURGERY  October, 2013   [facility]   Social History   Occupational History  . Not on file  Tobacco Use  . Smoking status: Current Some Day Smoker    Packs/day: 1.50  . Smokeless tobacco: Never Used  Substance and Sexual Activity  . Alcohol use: Yes    Alcohol/week: 2.4 - 4.8 oz    Types: 1 - 2 Glasses of wine, 3 - 6 Cans of beer per week  . Drug use: No  . Sexual activity: Not on file

## 2017-07-18 NOTE — Addendum Note (Signed)
Addended by: Meyer Cory on: 07/18/2017 11:49 AM   Modules accepted: Orders

## 2017-08-24 ENCOUNTER — Other Ambulatory Visit (INDEPENDENT_AMBULATORY_CARE_PROVIDER_SITE_OTHER): Payer: Self-pay | Admitting: Orthopaedic Surgery

## 2017-08-24 DIAGNOSIS — M545 Low back pain: Secondary | ICD-10-CM

## 2017-08-24 NOTE — Telephone Encounter (Signed)
Ok for refill? 

## 2017-08-24 NOTE — Telephone Encounter (Signed)
Ok refill thanks

## 2017-11-30 ENCOUNTER — Other Ambulatory Visit (INDEPENDENT_AMBULATORY_CARE_PROVIDER_SITE_OTHER): Payer: Self-pay | Admitting: Orthopaedic Surgery

## 2017-11-30 DIAGNOSIS — M545 Low back pain: Secondary | ICD-10-CM

## 2017-12-05 NOTE — Telephone Encounter (Signed)
Ok for refill? 

## 2018-03-11 ENCOUNTER — Other Ambulatory Visit (INDEPENDENT_AMBULATORY_CARE_PROVIDER_SITE_OTHER): Payer: Self-pay | Admitting: Orthopaedic Surgery

## 2018-03-11 DIAGNOSIS — M545 Low back pain: Secondary | ICD-10-CM

## 2018-03-11 NOTE — Telephone Encounter (Signed)
Ok for refill? 

## 2018-05-25 ENCOUNTER — Encounter (HOSPITAL_COMMUNITY): Payer: Self-pay | Admitting: Emergency Medicine

## 2018-05-25 ENCOUNTER — Emergency Department (HOSPITAL_COMMUNITY): Payer: Medicare Other

## 2018-05-25 ENCOUNTER — Emergency Department (HOSPITAL_COMMUNITY)
Admission: EM | Admit: 2018-05-25 | Discharge: 2018-05-25 | Disposition: A | Discharge status: Home / Self Care | Payer: Medicare Other | Attending: Emergency Medicine | Admitting: Emergency Medicine

## 2018-05-25 DIAGNOSIS — F17210 Nicotine dependence, cigarettes, uncomplicated: Secondary | ICD-10-CM | POA: Insufficient documentation

## 2018-05-25 DIAGNOSIS — Z85118 Personal history of other malignant neoplasm of bronchus and lung: Secondary | ICD-10-CM | POA: Diagnosis not present

## 2018-05-25 DIAGNOSIS — R259 Unspecified abnormal involuntary movements: Secondary | ICD-10-CM | POA: Insufficient documentation

## 2018-05-25 DIAGNOSIS — I10 Essential (primary) hypertension: Secondary | ICD-10-CM | POA: Diagnosis not present

## 2018-05-25 DIAGNOSIS — B1710 Acute hepatitis C without hepatic coma: Secondary | ICD-10-CM | POA: Diagnosis not present

## 2018-05-25 DIAGNOSIS — H9319 Tinnitus, unspecified ear: Secondary | ICD-10-CM | POA: Diagnosis not present

## 2018-05-25 DIAGNOSIS — Z79899 Other long term (current) drug therapy: Secondary | ICD-10-CM | POA: Insufficient documentation

## 2018-05-25 LAB — COMPREHENSIVE METABOLIC PANEL
ALK PHOS: 45 U/L (ref 38–126)
ALT: 12 U/L (ref 0–44)
AST: 18 U/L (ref 15–41)
Albumin: 4 g/dL (ref 3.5–5.0)
Anion gap: 9 (ref 5–15)
BUN: 17 mg/dL (ref 6–20)
CALCIUM: 9.6 mg/dL (ref 8.9–10.3)
CO2: 30 mmol/L (ref 22–32)
CREATININE: 1.5 mg/dL — AB (ref 0.61–1.24)
Chloride: 100 mmol/L (ref 98–111)
GFR calc non Af Amer: 50 mL/min — ABNORMAL LOW (ref >60)
GFR, EST AFRICAN AMERICAN: 58 mL/min — AB (ref >60)
GLUCOSE: 114 mg/dL — AB (ref 70–99)
Potassium: 3.8 mmol/L (ref 3.5–5.1)
SODIUM: 139 mmol/L (ref 135–145)
Total Bilirubin: 0.9 mg/dL (ref 0.3–1.2)
Total Protein: 6.9 g/dL (ref 6.5–8.1)

## 2018-05-25 LAB — CBC WITH DIFFERENTIAL/PLATELET
ABS IMMATURE GRANULOCYTES: 0.01 10*3/uL (ref 0.00–0.07)
Basophils Absolute: 0 10*3/uL (ref 0.0–0.1)
Basophils Relative: 0 %
Eosinophils Absolute: 0.1 10*3/uL (ref 0.0–0.5)
Eosinophils Relative: 1 %
HCT: 57.2 % — ABNORMAL HIGH (ref 39.0–52.0)
HEMOGLOBIN: 18 g/dL — AB (ref 13.0–17.0)
Immature Granulocytes: 0 %
LYMPHS ABS: 1.3 10*3/uL (ref 0.7–4.0)
LYMPHS PCT: 27 %
MCH: 31.6 pg (ref 26.0–34.0)
MCHC: 31.5 g/dL (ref 30.0–36.0)
MCV: 100.5 fL — AB (ref 80.0–100.0)
MONO ABS: 0.5 10*3/uL (ref 0.1–1.0)
MONOS PCT: 10 %
NRBC: 0 % (ref 0.0–0.2)
Neutro Abs: 2.9 10*3/uL (ref 1.7–7.7)
Neutrophils Relative %: 62 %
Platelets: 166 10*3/uL (ref 150–400)
RBC: 5.69 MIL/uL (ref 4.22–5.81)
RDW: 13.1 % (ref 11.5–15.5)
WBC: 4.8 10*3/uL (ref 4.0–10.5)

## 2018-05-25 MED ORDER — IOHEXOL 300 MG/ML  SOLN
75.0000 mL | Freq: Once | INTRAMUSCULAR | Status: AC | PRN
  Administered 2018-05-25: 75 mL via INTRAVENOUS

## 2018-05-25 MED ORDER — LISINOPRIL 10 MG PO TABS: 10.0000 mg | ORAL_TABLET | Freq: Every day | ORAL | 0 refills | Status: AC

## 2018-05-25 MED ORDER — LISINOPRIL 10 MG PO TABS
10.0000 mg | ORAL_TABLET | Freq: Once | ORAL | Status: AC
  Administered 2018-05-25: 10 mg via ORAL
  Filled 2018-05-25: qty 1

## 2018-05-25 MED ORDER — ACETAMINOPHEN 325 MG PO TABS
650.0000 mg | ORAL_TABLET | Freq: Once | ORAL | Status: AC
  Administered 2018-05-25: 650 mg via ORAL
  Filled 2018-05-25: qty 2

## 2018-05-25 MED ORDER — LISINOPRIL 10 MG PO TABS: 10.0000 mg | ORAL_TABLET | Freq: Once | ORAL | Status: DC

## 2018-05-25 NOTE — ED Provider Notes (Signed)
[facility] EMERGENCY DEPARTMENT Provider Note   CSN: [id number] Arrival date & time: 05/25/18  1055     History   Chief Complaint Chief Complaint  Patient presents with  . Shaking    HPI Johnny Contreras is a 57 y.o. male.  HPI  57 year old male history of lung cancer, alcoholism, presents today complaining of episode of ringing in ears followed by uncontrolled body movements that lasted approximately 15 minutes.  He states he was sitting the chair he felt like he heard he asked his wife for it was.  She went to check on the cat.  States when she came back his arms were generally shaking.  He was awake.  There is no loss of bowel or bladder control.  He was trying to get up out of the chair but she told him not to because she had called 911 and they told her not to let him stand up.  There were no lateralized deficits.  He was not having any difficulty with speech or vision.  He is complaining of some pressure type pain in the back of his head.  Past Medical History:  Diagnosis Date  . Acute kidney injury ([place]) 06/22/2012  . Alcoholism /alcohol abuse ([place]) 06/22/2012   Daily beers and 'Hennessey' (cognac)   . Arthritis   . Cancer of lower lobe of lung ([place]) 03/20/2012  . Chronic kidney disease    kidney stones ?  Marland Kitchen GERD (gastroesophageal reflux disease)   . Hepatitis C   . Hypertension   . Lung cancer [facility]) October, 2013   [facility] at [facility]  . Pleural effusion, left 06/22/2012    Patient Active Problem List   Diagnosis Date Noted  . Acute back pain with sciatica, left 06/16/2016  . Hepatitis C 12/17/2014  . Pleural effusion, left 06/22/2012  . Hypertension 06/22/2012  . Alcoholism /alcohol abuse ([place]) 06/22/2012  . Tobacco abuse 06/22/2012  . Lung cancer ([place]) 06/22/2012  . Pleuritic chest pain 06/22/2012  . Arthritis 06/22/2012  . Acute kidney injury ([place]) 06/22/2012  . GERD (gastroesophageal reflux disease) 06/22/2012  . Cancer of lower  lobe of lung ([place]) 03/20/2012  . Back ache 02/28/2012  . CAFL (chronic airflow limitation) ([place]) 01/25/2012  . HNP (herniated nucleus pulposus), lumbar 06/30/2011    Class: Diagnosis of    Past Surgical History:  Procedure Laterality Date  . CARPAL TUNNEL RELEASE     bil  . LUMBAR LAMINECTOMY  06/30/2011   Procedure: MICRODISCECTOMY LUMBAR LAMINECTOMY;  Surgeon: Marybelle Killings;  Location: [place];  Service: Orthopedics;  Laterality: Left;  Left L5-S1 Microdiscectomy  . LUNG CANCER SURGERY  October, 2013   [facility]        Home Medications    Prior to Admission medications   Medication Sig Start Date End Date Taking? Authorizing Provider  AMLODIPINE BESYLATE PO Take by mouth.    [provider]  cyclobenzaprine (FLEXERIL) 10 MG tablet TAKE 1 TABLET BY MOUTH AT BEDTIME AS NEEDED 03/11/18   Marybelle Killings, MD  GABAPENTIN PO Take by mouth.    [provider]  LISINOPRIL PO Take by mouth.    [provider]  OMEPRAZOLE PO Take by mouth.    [provider]  oxyCODONE (OXY IR/ROXICODONE) 5 MG immediate release tablet Take 5 mg by mouth every 4 (four) hours as needed. 10/23/14   [provider]  oxyCODONE-acetaminophen (PERCOCET) 5-325 MG tablet Take 1 tablet by mouth every 4 (four)  hours as needed. Patient not taking: Reported on 07/18/2017 06/15/17   Pisciotta, Elmyra Ricks, PA-C  ranitidine (ZANTAC) 150 MG capsule Take by mouth.    [provider]  traMADol (ULTRAM) 50 MG tablet Take 1 tablet (50 mg total) by mouth every 12 (twelve) hours as needed. 07/18/17   Marybelle Killings, MD    Family History Family History  Problem Relation Age of Onset  . Emphysema Father     Social History Social History   Tobacco Use  . Smoking status: Current Some Day Smoker    Packs/day: 1.50  . Smokeless tobacco: Never Used  Substance Use Topics  . Alcohol use: Yes    Alcohol/week: 4.0 - 8.0 standard drinks    Types: 1 - 2 Glasses of wine, 3 - 6 Cans of beer per  week  . Drug use: No     Allergies   Amitriptyline   Review of Systems Review of Systems  All other systems reviewed and are negative.    Physical Exam Updated Vital Signs BP (!) 169/89   Pulse 65   Temp 97.7 F (36.5 C) (Oral)   Resp 17   Ht 1.702 m (5\' 7" )   Wt 81.6 kg   SpO2 95%   BMI 28.19 kg/m   Physical Exam  Constitutional: He is oriented to person, place, and time. He appears well-developed and well-nourished.  HENT:  Head: Normocephalic and atraumatic.  Right Ear: External ear normal.  Left Ear: External ear normal.  Nose: Nose normal.  Mouth/Throat: Oropharynx is clear and moist.  Eyes: Pupils are equal, round, and reactive to light. EOM are normal.  Neck: Normal range of motion.  Cardiovascular: Normal rate, regular rhythm, normal heart sounds and intact distal pulses.  Pulmonary/Chest: Effort normal and breath sounds normal.  Abdominal: Soft. Bowel sounds are normal.  Musculoskeletal: Normal range of motion.  Neurological: He is alert and oriented to person, place, and time. He displays normal reflexes. No cranial nerve deficit or sensory deficit. He exhibits normal muscle tone. Coordination normal.  Patient ambulatory with normal gait He is able to stand on his toes and heels.  Skin: Skin is warm and dry. Capillary refill takes less than 2 seconds.  Nursing note and vitals reviewed.    ED Treatments / Results  Labs (all labs ordered are listed, but only abnormal results are displayed) Labs Reviewed  CBC WITH DIFFERENTIAL/PLATELET - Abnormal; Notable for the following components:      Result Value   Hemoglobin 18.0 (*)    HCT 57.2 (*)    MCV 100.5 (*)    All other components within normal limits  COMPREHENSIVE METABOLIC PANEL - Abnormal; Notable for the following components:   Glucose, Bld 114 (*)    Creatinine, Ser 1.50 (*)    GFR calc non Af Amer 50 (*)    GFR calc Af Amer 58 (*)    All other components within normal limits    EKG EKG  Interpretation  Date/Time:  Saturday May 25 2018 11:09:44 EST Ventricular Rate:  87 PR Interval:    QRS Duration: 99 QT Interval:  381 QTC Calculation: 459 R Axis:   -48 Text Interpretation:  Normal sinus rhythm Non-specific ST-t changes changes present on ekg of 23 June 2011 Confirmed by Pattricia Boss [id number]) on 05/25/2018 12:13:31 PM   Radiology No results found.  Procedures Procedures (including critical care time)  Medications Ordered in ED Medications  acetaminophen (TYLENOL) tablet 650 mg (650 mg Oral Given  05/25/18 1205)  lisinopril (PRINIVIL,ZESTRIL) tablet 10 mg (10 mg Oral Given 05/25/18 1225)     Initial Impression / Assessment and Plan / ED Course  I have reviewed the triage vital signs and the nursing notes.  Pertinent labs & imaging results that were available during my care of the patient were reviewed by me and considered in my medical decision making (see chart for details).     With episode of hearing and abnormal movement of all extremities.  Doubt seizure as patient awake and alert and able to be purposeful throughout episode Doubt stroke with no lateralized deficits Patient with known lung cancer.  Plan obtain CT of head with and without contrast to assess for metastatic disease Patient with out acute changes on head CT.  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  I discussed results of labs and CT with patient and family.  Discussed return precautions and need for follow-up.  We discussed risk factor modification specifically beginning his lisinopril again, stopping smoking, and diet. Vitals:   05/25/18 1145 05/25/18 1241  BP: (!) 162/91 (!) 169/89  Pulse: 72 65  Resp: 18 17  Temp:    SpO2: 92% 95%    Final Clinical Impressions(s) / ED Diagnoses   Final diagnoses:  Abnormal movements    ED Discharge Orders    None       Pattricia Boss, MD 05/25/18 1417

## 2018-05-25 NOTE — ED Triage Notes (Signed)
Pt reports about an hour ago he was sitting at home and his arms and legs began to shake and he was not able to control them for 15-20 mins. States he did not lose control of bowels or bladder and was awake during the shaking. Reports pain in back of head that feels like pressure.

## 2018-05-25 NOTE — Discharge Instructions (Signed)
Please take bp medications as prescribed Make appointment for follow up and recheck of bp Stop smoking Return if you have return of symptoms or are worse at any time

## 2018-05-25 NOTE — ED Notes (Signed)
Pt verbalized understanding of discharge paperwork and follow-up care.  °

## 2018-05-25 NOTE — ED Notes (Signed)
Patient transported to CT 

## 2018-06-24 ENCOUNTER — Other Ambulatory Visit (INDEPENDENT_AMBULATORY_CARE_PROVIDER_SITE_OTHER): Payer: Self-pay | Admitting: Orthopaedic Surgery

## 2018-06-24 DIAGNOSIS — M545 Low back pain, unspecified: Secondary | ICD-10-CM

## 2018-06-24 NOTE — Telephone Encounter (Signed)
Ok for refill? 

## 2018-10-25 DEATH — deceased

## 2020-07-31 IMAGING — CT CT HEAD WO/W CM
3 of 6 series · 15 of 47 positions shown, 18 images · IV contrast (omnipaque)
Comparison: None.

CLINICAL DATA: Altered level of consciousness. Headache. History of
lung cancer

EXAM:
CT HEAD WITHOUT AND WITH CONTRAST
TECHNIQUE: Contiguous axial images were obtained from the base of the skull
through the vertex without and with intravenous contrast
CONTRAST:  75mL OMNIPAQUE IOHEXOL 300 MG/ML  SOLN

[Series 3: head without without · axial · non-contrast · 0.46mm/px · z∈[-64,+56]mm · 10 of 30 slices shown, 13 images]
[im 3/30  brain]
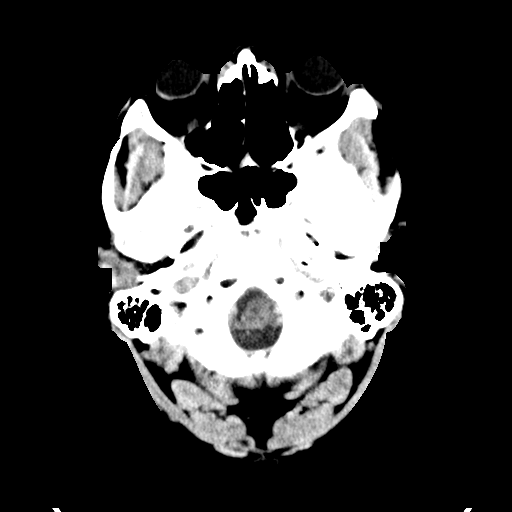
[im 3/30  bone]
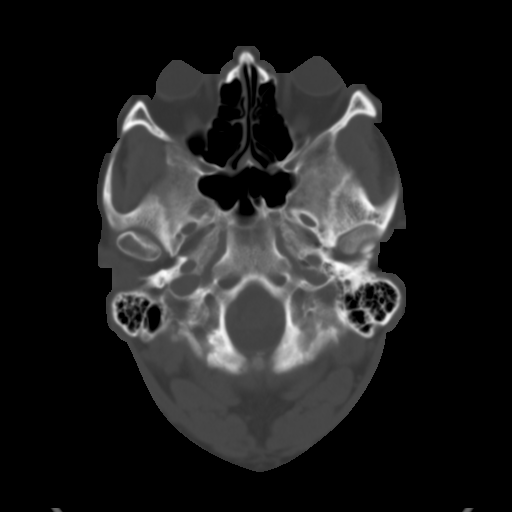
[im 5/30  brain]
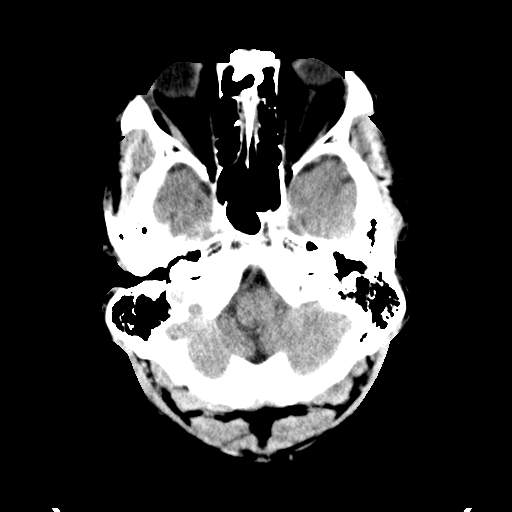
[im 9/30  brain]
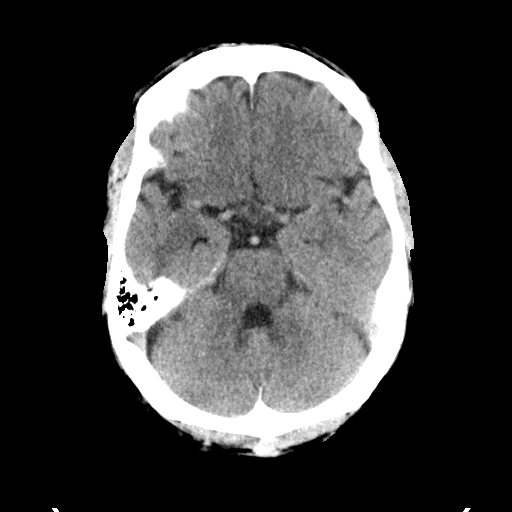
[im 11/30  brain]
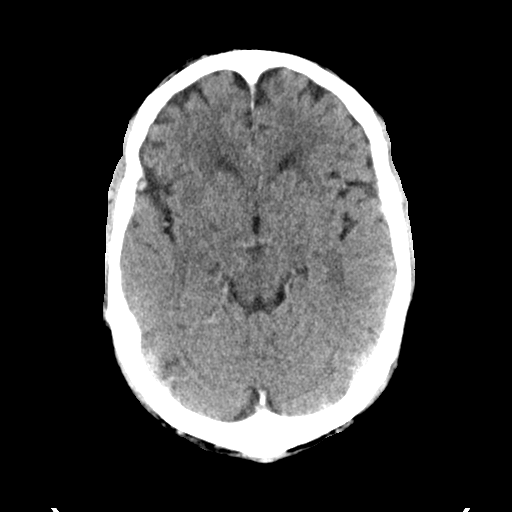
[im 13/30  brain]
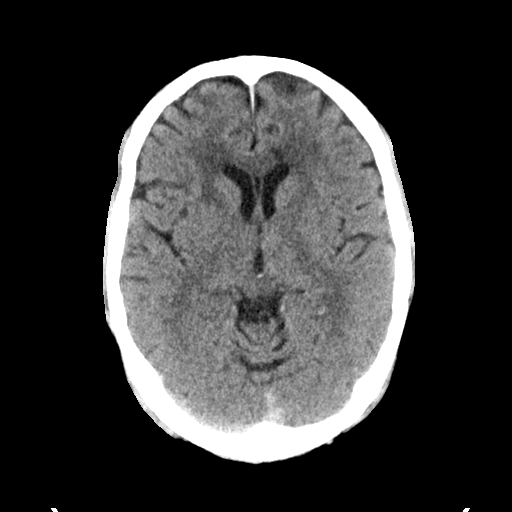
[im 13/30  bone]
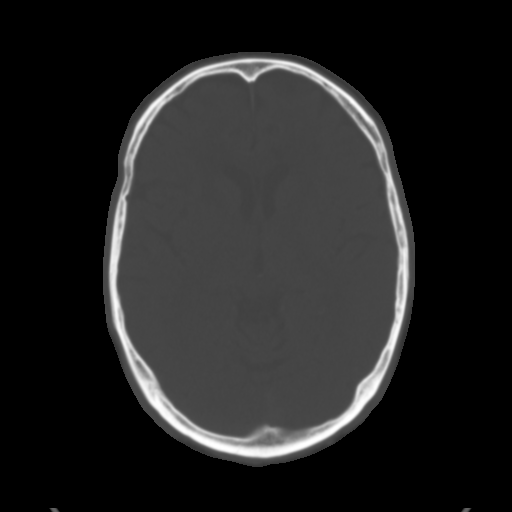
[im 17/30  brain]
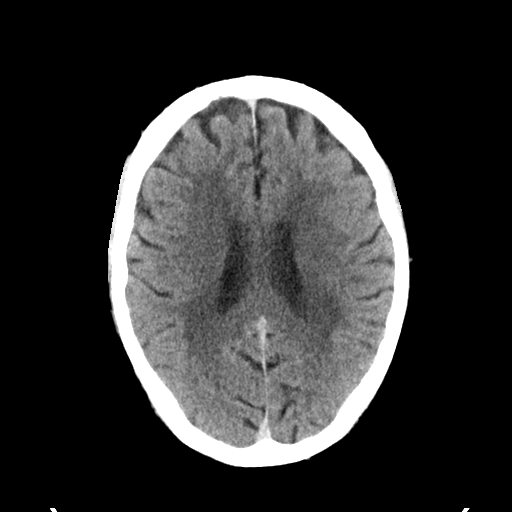
[im 19/30  brain]
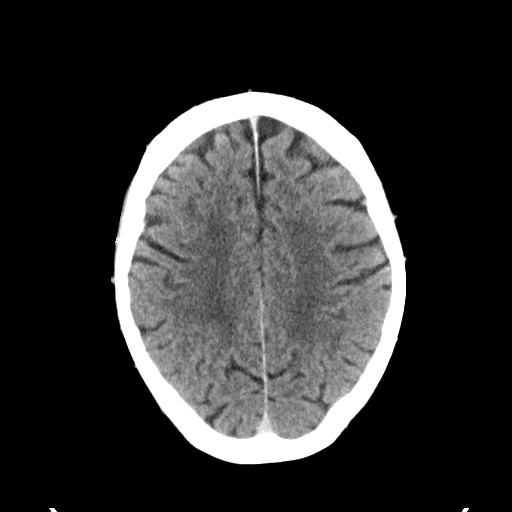
[im 21/30  brain]
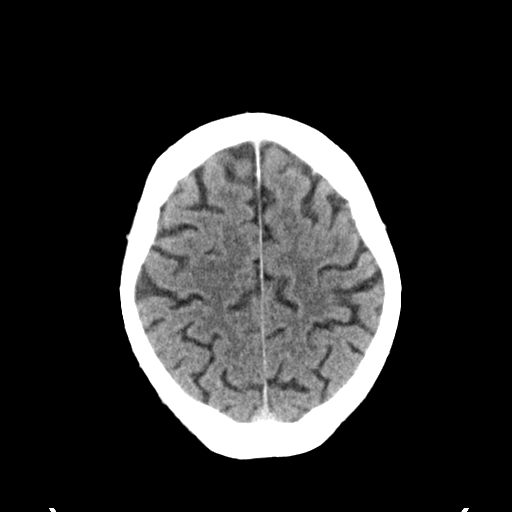
[im 25/30  brain]
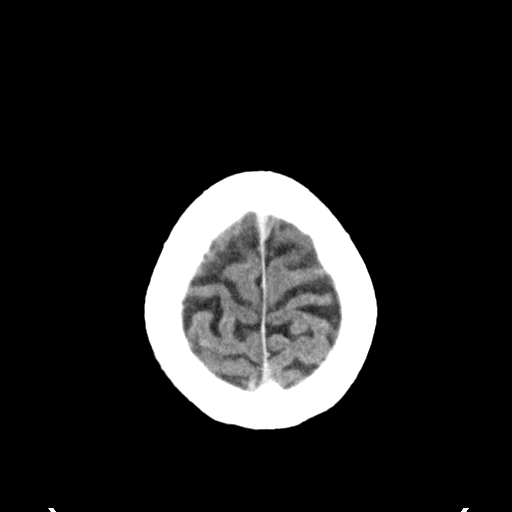
[im 25/30  bone]
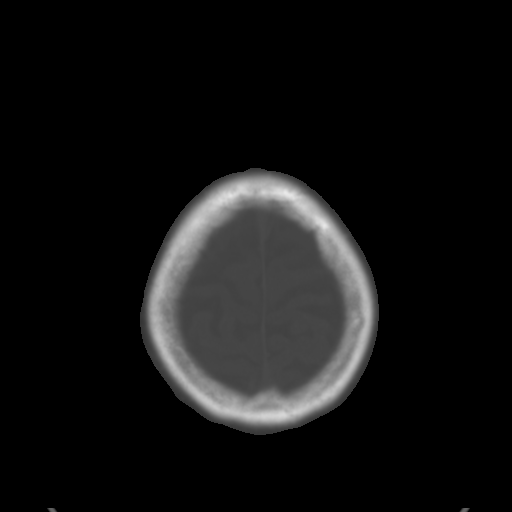
[im 27/30  brain]
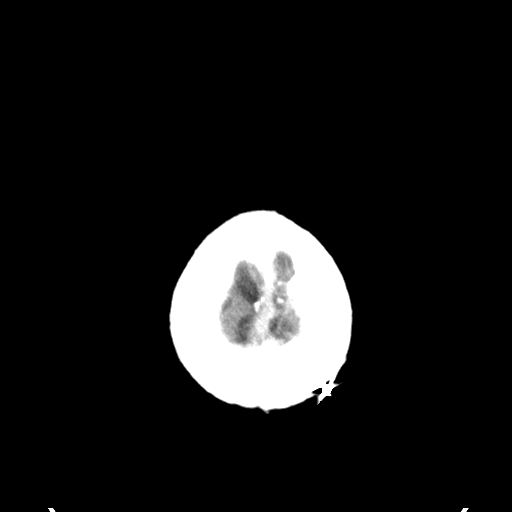

[Series 5: coronal · coronal · 0.30mm/px · 3 of 65 slices shown]
[im 17/65  brain]
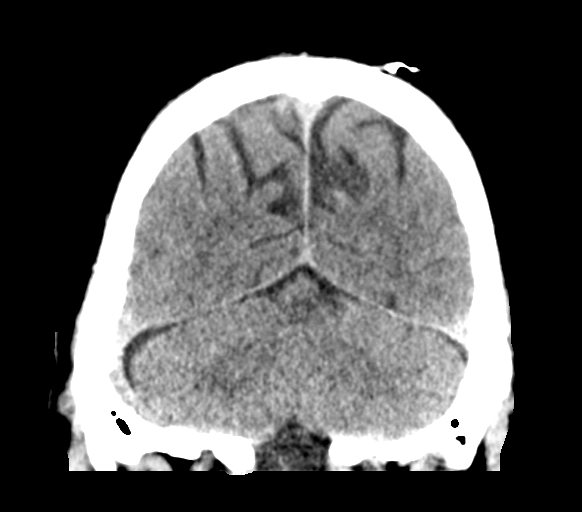
[im 33/65  brain]
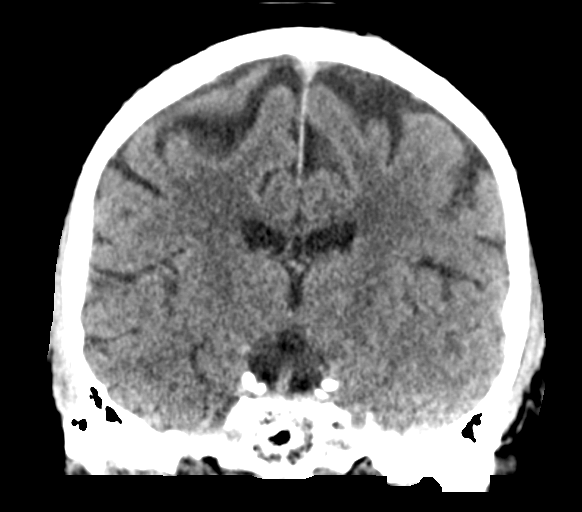
[im 49/65  brain]
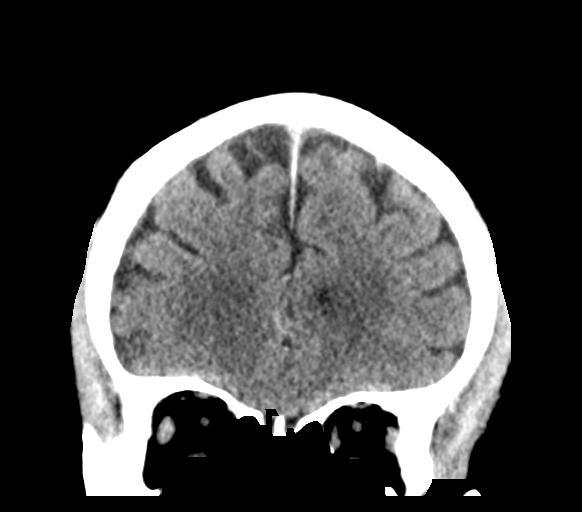

[Series 9: head with sag · sagittal · 0.31mm/px · 2 of 51 slices shown]
[im 17/51  brain]
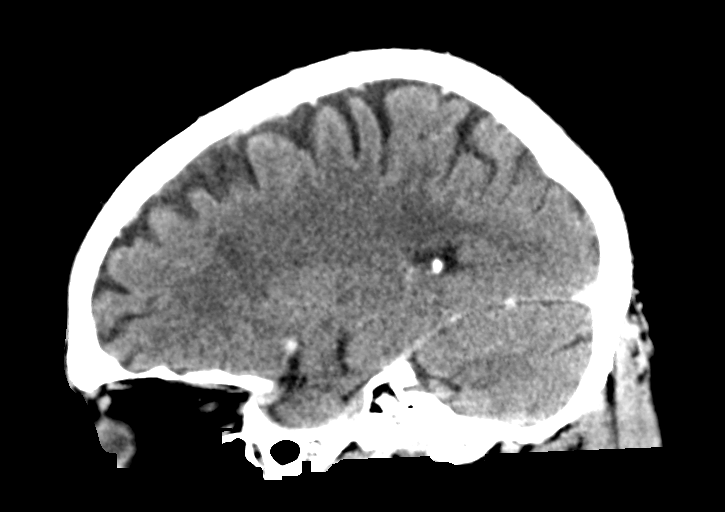
[im 34/51  brain]
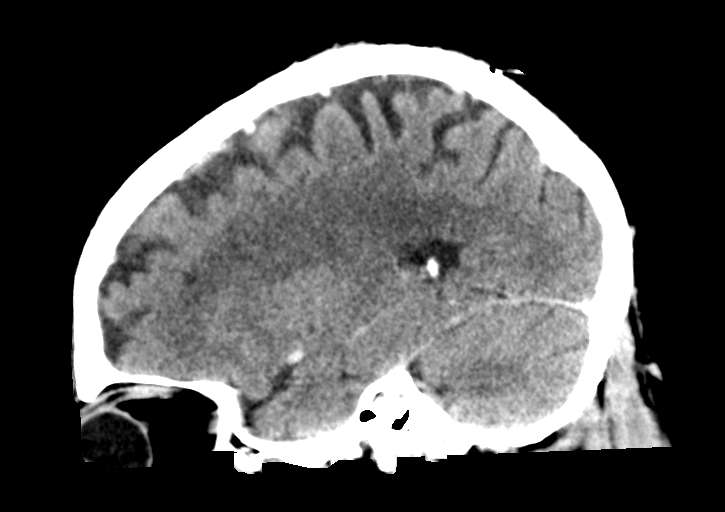

[15 of 47 positions shown; findings below may reference images not displayed]

FINDINGS: Brain: Patchy hypodensity in the cerebral white matter bilaterally
appears chronic. Small chronic infarct right cerebellum. Negative
for acute infarct, hemorrhage, or mass.

Normal enhancement postcontrast administration.

Vascular: Diffuse hyperdensity of arteries and veins prior to
contrast compatible with elevated hemoglobin. Negative for acute
vascular thrombosis

Skull: Negative

Sinuses/Orbits: Negative

Other: None
IMPRESSION: Chronic microvascular ischemic change in the white matter. Chronic
infarct right cerebellum. No acute abnormality.
